# Patient Record
Sex: Female | Born: 1965 | Race: Black or African American | Hispanic: No | Marital: Married | Smoking: Former smoker
Health system: Southern US, Community
[De-identification: ages and names within clinical notes are randomized; demographics above are authoritative.]

## PROBLEM LIST (undated history)

## (undated) DIAGNOSIS — Z87440 Personal history of urinary (tract) infections: Secondary | ICD-10-CM

## (undated) DIAGNOSIS — Z90710 Acquired absence of both cervix and uterus: Secondary | ICD-10-CM

## (undated) DIAGNOSIS — C801 Malignant (primary) neoplasm, unspecified: Secondary | ICD-10-CM

## (undated) DIAGNOSIS — K219 Gastro-esophageal reflux disease without esophagitis: Secondary | ICD-10-CM

## (undated) DIAGNOSIS — R0602 Shortness of breath: Secondary | ICD-10-CM

## (undated) DIAGNOSIS — M79606 Pain in leg, unspecified: Secondary | ICD-10-CM

## (undated) DIAGNOSIS — B351 Tinea unguium: Secondary | ICD-10-CM

## (undated) HISTORY — DX: Tinea unguium: B35.1

## (undated) HISTORY — DX: Personal history of urinary (tract) infections: Z87.440

## (undated) HISTORY — PX: TUBAL LIGATION: SHX77

## (undated) HISTORY — DX: Pain in leg, unspecified: M79.606

## (undated) HISTORY — DX: Acquired absence of both cervix and uterus: Z90.710

## (undated) HISTORY — DX: Gastro-esophageal reflux disease without esophagitis: K21.9

## (undated) HISTORY — PX: OVARIAN CYST SURGERY: SHX726

---

## 2000-11-13 ENCOUNTER — Emergency Department (HOSPITAL_COMMUNITY): Admission: EM | Admit: 2000-11-13 | Discharge: 2000-11-13 | Payer: Self-pay | Admitting: Emergency Medicine

## 2000-11-13 ENCOUNTER — Encounter: Payer: Self-pay | Admitting: Emergency Medicine

## 2000-11-28 ENCOUNTER — Other Ambulatory Visit: Admission: RE | Admit: 2000-11-28 | Discharge: 2000-11-28 | Payer: Self-pay | Admitting: Internal Medicine

## 2000-12-12 ENCOUNTER — Encounter: Admission: RE | Admit: 2000-12-12 | Discharge: 2000-12-12 | Payer: Self-pay | Admitting: Internal Medicine

## 2000-12-12 ENCOUNTER — Encounter: Payer: Self-pay | Admitting: Internal Medicine

## 2002-07-16 ENCOUNTER — Other Ambulatory Visit: Admission: RE | Admit: 2002-07-16 | Discharge: 2002-07-16 | Payer: Self-pay | Admitting: Family Medicine

## 2003-02-12 ENCOUNTER — Emergency Department (HOSPITAL_COMMUNITY): Admission: EM | Admit: 2003-02-12 | Discharge: 2003-02-12 | Payer: Self-pay | Admitting: Emergency Medicine

## 2003-02-12 ENCOUNTER — Encounter: Payer: Self-pay | Admitting: Emergency Medicine

## 2005-03-14 ENCOUNTER — Ambulatory Visit: Payer: Self-pay | Admitting: Internal Medicine

## 2006-03-14 ENCOUNTER — Ambulatory Visit: Payer: Self-pay | Admitting: Internal Medicine

## 2006-03-26 ENCOUNTER — Ambulatory Visit: Payer: Self-pay | Admitting: Internal Medicine

## 2006-03-26 ENCOUNTER — Other Ambulatory Visit: Admission: RE | Admit: 2006-03-26 | Discharge: 2006-03-26 | Payer: Self-pay | Admitting: Internal Medicine

## 2006-03-26 ENCOUNTER — Encounter (INDEPENDENT_AMBULATORY_CARE_PROVIDER_SITE_OTHER): Payer: Self-pay | Admitting: *Deleted

## 2006-08-14 ENCOUNTER — Ambulatory Visit: Payer: Self-pay | Admitting: Internal Medicine

## 2006-09-04 ENCOUNTER — Encounter: Admission: RE | Admit: 2006-09-04 | Discharge: 2006-09-04 | Payer: Self-pay | Admitting: Internal Medicine

## 2006-09-22 ENCOUNTER — Ambulatory Visit: Payer: Self-pay | Admitting: Internal Medicine

## 2007-09-10 HISTORY — PX: ABDOMINAL HYSTERECTOMY: SHX81

## 2007-09-18 ENCOUNTER — Ambulatory Visit: Payer: Self-pay | Admitting: Internal Medicine

## 2007-09-18 DIAGNOSIS — N390 Urinary tract infection, site not specified: Secondary | ICD-10-CM

## 2007-09-18 LAB — CONVERTED CEMR LAB
ALT: 12 units/L (ref 0–35)
Alkaline Phosphatase: 69 units/L (ref 39–117)
BUN: 7 mg/dL (ref 6–23)
Basophils Absolute: 0 10*3/uL (ref 0.0–0.1)
Bilirubin Urine: NEGATIVE
Bilirubin, Direct: 0.1 mg/dL (ref 0.0–0.3)
CO2: 25 meq/L (ref 19–32)
Chloride: 106 meq/L (ref 96–112)
Cholesterol: 141 mg/dL (ref 0–200)
Creatinine, Ser: 0.7 mg/dL (ref 0.4–1.2)
Eosinophils Relative: 3.5 % (ref 0.0–5.0)
Glucose, Bld: 87 mg/dL (ref 70–99)
HDL: 66 mg/dL (ref >39.0)
Hemoglobin: 10 g/dL — ABNORMAL LOW (ref 12.0–15.0)
Ketones, urine, test strip: NEGATIVE
LDL Cholesterol: 68 mg/dL (ref 0–99)
MCHC: 33.2 g/dL (ref 30.0–36.0)
Monocytes Absolute: 0.6 10*3/uL (ref 0.2–0.7)
Neutro Abs: 4.4 10*3/uL (ref 1.4–7.7)
Neutrophils Relative %: 59.9 % (ref 43.0–77.0)
Protein, U semiquant: NEGATIVE
RBC: 3.98 M/uL (ref 3.87–5.11)
Triglycerides: 37 mg/dL (ref 0–149)
VLDL: 7 mg/dL (ref 0–40)
pH: 5.5

## 2007-10-16 ENCOUNTER — Other Ambulatory Visit: Admission: RE | Admit: 2007-10-16 | Discharge: 2007-10-16 | Payer: Self-pay | Admitting: Internal Medicine

## 2007-10-16 ENCOUNTER — Encounter: Payer: Self-pay | Admitting: Internal Medicine

## 2007-10-16 ENCOUNTER — Ambulatory Visit: Payer: Self-pay | Admitting: Internal Medicine

## 2007-10-16 DIAGNOSIS — K5900 Constipation, unspecified: Secondary | ICD-10-CM | POA: Insufficient documentation

## 2007-10-16 DIAGNOSIS — K219 Gastro-esophageal reflux disease without esophagitis: Secondary | ICD-10-CM

## 2007-10-16 DIAGNOSIS — N920 Excessive and frequent menstruation with regular cycle: Secondary | ICD-10-CM

## 2007-10-16 DIAGNOSIS — R5381 Other malaise: Secondary | ICD-10-CM | POA: Insufficient documentation

## 2007-10-16 DIAGNOSIS — D509 Iron deficiency anemia, unspecified: Secondary | ICD-10-CM

## 2007-10-16 DIAGNOSIS — R5383 Other fatigue: Secondary | ICD-10-CM

## 2007-10-16 LAB — CONVERTED CEMR LAB
Bilirubin Urine: NEGATIVE
Glucose, Urine, Semiquant: NEGATIVE
Ketones, urine, test strip: NEGATIVE
Nitrite: POSITIVE
Urobilinogen, UA: 1
WBC Urine, dipstick: NEGATIVE

## 2007-10-17 ENCOUNTER — Encounter: Payer: Self-pay | Admitting: Internal Medicine

## 2007-10-23 ENCOUNTER — Encounter: Admission: RE | Admit: 2007-10-23 | Discharge: 2007-10-23 | Payer: Self-pay | Admitting: Internal Medicine

## 2007-12-17 ENCOUNTER — Ambulatory Visit (HOSPITAL_COMMUNITY): Admission: RE | Admit: 2007-12-17 | Discharge: 2007-12-18 | Payer: Self-pay | Admitting: Obstetrics and Gynecology

## 2007-12-17 ENCOUNTER — Encounter (INDEPENDENT_AMBULATORY_CARE_PROVIDER_SITE_OTHER): Payer: Self-pay | Admitting: Obstetrics and Gynecology

## 2008-11-10 ENCOUNTER — Ambulatory Visit: Payer: Self-pay | Admitting: Internal Medicine

## 2008-11-10 LAB — CONVERTED CEMR LAB
AST: 21 units/L (ref 0–37)
Albumin: 3.3 g/dL — ABNORMAL LOW (ref 3.5–5.2)
Alkaline Phosphatase: 86 units/L (ref 39–117)
BUN: 8 mg/dL (ref 6–23)
Basophils Absolute: 0 10*3/uL (ref 0.0–0.1)
Bilirubin Urine: NEGATIVE
Bilirubin, Direct: 0.1 mg/dL (ref 0.0–0.3)
Blood in Urine, dipstick: NEGATIVE
Cholesterol: 147 mg/dL (ref 0–200)
GFR calc non Af Amer: 98 mL/min
HCT: 39 % (ref 36.0–46.0)
HDL: 56.4 mg/dL (ref >39.0)
Hemoglobin: 13.3 g/dL (ref 12.0–15.0)
Lymphocytes Relative: 28.9 % (ref 12.0–46.0)
MCHC: 34 g/dL (ref 30.0–36.0)
MCV: 93.8 fL (ref 78.0–100.0)
Monocytes Absolute: 0.4 10*3/uL (ref 0.1–1.0)
Neutrophils Relative %: 59.4 % (ref 43.0–77.0)
Potassium: 3.6 meq/L (ref 3.5–5.1)
Protein, U semiquant: NEGATIVE
RBC: 4.16 M/uL (ref 3.87–5.11)
Sodium: 142 meq/L (ref 135–145)
Triglycerides: 54 mg/dL (ref 0–149)
VLDL: 11 mg/dL (ref 0–40)
pH: 6.5

## 2008-12-27 ENCOUNTER — Telehealth: Payer: Self-pay | Admitting: Internal Medicine

## 2009-01-05 ENCOUNTER — Ambulatory Visit: Payer: Self-pay | Admitting: Internal Medicine

## 2009-01-05 DIAGNOSIS — G5600 Carpal tunnel syndrome, unspecified upper limb: Secondary | ICD-10-CM | POA: Insufficient documentation

## 2009-01-05 DIAGNOSIS — B351 Tinea unguium: Secondary | ICD-10-CM

## 2009-04-18 ENCOUNTER — Ambulatory Visit: Payer: Self-pay | Admitting: Internal Medicine

## 2009-04-18 DIAGNOSIS — R030 Elevated blood-pressure reading, without diagnosis of hypertension: Secondary | ICD-10-CM | POA: Insufficient documentation

## 2009-04-18 DIAGNOSIS — M79609 Pain in unspecified limb: Secondary | ICD-10-CM | POA: Insufficient documentation

## 2009-04-18 LAB — CONVERTED CEMR LAB
Bilirubin Urine: NEGATIVE
Blood in Urine, dipstick: NEGATIVE
Ketones, urine, test strip: NEGATIVE
Nitrite: POSITIVE
Specific Gravity, Urine: 1.025
WBC Urine, dipstick: NEGATIVE
pH: 5

## 2009-05-22 ENCOUNTER — Ambulatory Visit: Payer: Self-pay | Admitting: Internal Medicine

## 2009-05-22 DIAGNOSIS — B373 Candidiasis of vulva and vagina: Secondary | ICD-10-CM

## 2009-05-22 LAB — CONVERTED CEMR LAB
Bilirubin Urine: NEGATIVE
Blood in Urine, dipstick: NEGATIVE
Ketones, urine, test strip: NEGATIVE
Protein, U semiquant: NEGATIVE
Specific Gravity, Urine: 1.01
Urobilinogen, UA: 0.2

## 2009-08-18 ENCOUNTER — Encounter: Admission: RE | Admit: 2009-08-18 | Discharge: 2009-08-18 | Payer: Self-pay | Admitting: Internal Medicine

## 2009-08-25 ENCOUNTER — Encounter (INDEPENDENT_AMBULATORY_CARE_PROVIDER_SITE_OTHER): Payer: Self-pay | Admitting: *Deleted

## 2009-08-25 ENCOUNTER — Encounter: Admission: RE | Admit: 2009-08-25 | Discharge: 2009-08-25 | Payer: Self-pay | Admitting: Internal Medicine

## 2010-02-23 ENCOUNTER — Ambulatory Visit: Payer: Self-pay | Admitting: Internal Medicine

## 2010-02-23 LAB — CONVERTED CEMR LAB
ALT: 18 units/L (ref 0–35)
AST: 19 units/L (ref 0–37)
Alkaline Phosphatase: 93 units/L (ref 39–117)
Basophils Relative: 0.5 % (ref 0.0–3.0)
Bilirubin, Direct: 0.1 mg/dL (ref 0.0–0.3)
CO2: 26 meq/L (ref 19–32)
Chloride: 108 meq/L (ref 96–112)
Eosinophils Relative: 2.9 % (ref 0.0–5.0)
Glucose, Bld: 85 mg/dL (ref 70–99)
HDL: 68.1 mg/dL (ref >39.00)
MCHC: 34.6 g/dL (ref 30.0–36.0)
MCV: 96.8 fL (ref 78.0–100.0)
Platelets: 237 10*3/uL (ref 150.0–400.0)
Potassium: 4.3 meq/L (ref 3.5–5.1)
RDW: 13.7 % (ref 11.5–14.6)
Sodium: 140 meq/L (ref 135–145)
Total CHOL/HDL Ratio: 2
Total Protein: 7.2 g/dL (ref 6.0–8.3)
Triglycerides: 119 mg/dL (ref 0.0–149.0)
VLDL: 23.8 mg/dL (ref 0.0–40.0)

## 2010-03-02 ENCOUNTER — Encounter: Admission: RE | Admit: 2010-03-02 | Discharge: 2010-03-02 | Payer: Self-pay | Admitting: Internal Medicine

## 2010-04-06 ENCOUNTER — Telehealth: Payer: Self-pay | Admitting: Internal Medicine

## 2010-06-20 ENCOUNTER — Ambulatory Visit: Payer: Self-pay | Admitting: Family Medicine

## 2010-06-20 ENCOUNTER — Encounter: Payer: Self-pay | Admitting: Internal Medicine

## 2010-06-20 ENCOUNTER — Telehealth: Payer: Self-pay | Admitting: Internal Medicine

## 2010-06-20 DIAGNOSIS — E663 Overweight: Secondary | ICD-10-CM | POA: Insufficient documentation

## 2010-06-20 DIAGNOSIS — IMO0002 Reserved for concepts with insufficient information to code with codable children: Secondary | ICD-10-CM

## 2010-08-17 ENCOUNTER — Encounter: Payer: Self-pay | Admitting: Internal Medicine

## 2010-08-17 ENCOUNTER — Ambulatory Visit: Payer: Self-pay | Admitting: Internal Medicine

## 2010-08-17 DIAGNOSIS — M545 Low back pain, unspecified: Secondary | ICD-10-CM | POA: Insufficient documentation

## 2010-08-17 DIAGNOSIS — E669 Obesity, unspecified: Secondary | ICD-10-CM

## 2010-08-17 DIAGNOSIS — M549 Dorsalgia, unspecified: Secondary | ICD-10-CM | POA: Insufficient documentation

## 2010-09-04 ENCOUNTER — Encounter
Admission: RE | Admit: 2010-09-04 | Discharge: 2010-09-04 | Payer: Self-pay | Source: Home / Self Care | Attending: Internal Medicine | Admitting: Internal Medicine

## 2010-09-04 LAB — HM MAMMOGRAPHY

## 2010-10-09 NOTE — Progress Notes (Signed)
Summary: Request to Switch PCP  Phone Note Call from Patient Call back at Home Phone [phone]   Caller: Patient Summary of Call: Pt requests to switch pcp from [facility] to Aashish Hamm please advise if of to transfer. Initial call taken by: Trixie Dredge,  June 20, 2010 4:02 PM  Follow-up for Phone Call        ok if ok with Dr Kirtland Bouchard Follow-up by: Madelin Headings MD,  June 20, 2010 5:49 PM  Additional Follow-up for Phone Call Additional follow up Details #1::        ok Additional Follow-up by: Gordy Savers  MD,  June 21, 2010 8:40 AM    Additional Follow-up for Phone Call Additional follow up Details #2::    LMOM to return call. Follow-up by: Trixie Dredge,  June 21, 2010 9:16 AM

## 2010-10-09 NOTE — Progress Notes (Signed)
Summary: NO CALL NO SHOW cpx  Phone Note Outgoing Call   Call placed by: Duard Brady LPN,  April 06, 2010 1:46 PM Call placed to: Patient Action Taken: Phone Call Completed Summary of Call: NO CALL NO SHOW - cpx - attempt to call at hm# - ans mach - LMTCB to r/s. KIK Initial call taken by: Duard Brady LPN,  April 06, 2010 1:46 PM

## 2010-10-09 NOTE — Assessment & Plan Note (Signed)
Summary: TENDONITIS FLARE UP / PT TO LAB AFTER APPT (CPX LABS V70.0 //...   Vital Signs:  Patient profile:   45 year old female Weight:      204 pounds Temp:     98.7 degrees F oral BP sitting:   120 / 80  (right arm) Cuff size:   regular  Vitals Entered By: Duard Brady LPN (February 23, 2010 8:30 AM) CC: Tendontiits both arms Is Patient Diabetic? No   CC:  Tendontiits both arms.  History of Present Illness: 45 year old patient who presents with a two-day history of some shoulder.  The discomfort and tightness also describes some paresthesias down the left arm and also the right arm across to the right anterior chest area.  She states that 6 years ago.  She was treated for tendinitis involving the left elbow.  She does have a history of carpal tunnel syndrome and does use a wrist braces.  Sterile fashion using a brace, involving the left elbow region.  She denies any significant neck pain or any loss in strength.  Allergies (verified): No Known Drug Allergies  Past History:  Past Medical History: Reviewed history from 04/18/2009 and no changes required. GERD toenail onychomycosis leg pain  Review of Systems  The patient denies anorexia, fever, weight loss, weight gain, vision loss, decreased hearing, hoarseness, chest pain, syncope, dyspnea on exertion, peripheral edema, prolonged cough, headaches, hemoptysis, abdominal pain, melena, hematochezia, severe indigestion/heartburn, hematuria, incontinence, genital sores, muscle weakness, suspicious skin lesions, transient blindness, difficulty walking, depression, unusual weight change, abnormal bleeding, enlarged lymph nodes, angioedema, and breast masses.    Physical Exam  General:  overweight-appearing.  normal blood pressure, no distressoverweight-appearing.   Msk:  No deformity or scoliosis noted of thoracic or lumbar spine.  no joint tenderness, no joint swelling, no redness over joints, and no joint instability.  no joint  tenderness, no joint swelling, no redness over joints, and no joint instability.   Neurologic:  alert & oriented X3, cranial nerves II-XII intact, strength normal in all extremities, and DTRs symmetrical and normal.  alert & oriented X3, cranial nerves II-XII intact, strength normal in all extremities, and DTRs symmetrical and normal.     Impression & Recommendations:  Problem # 1:  PAIN IN SOFT TISSUES OF LIMB (ICD-729.5)  Complete Medication List: 1)  Diclofenac Sodium 75 Mg Tbec (Diclofenac sodium) .... One twice daily  Patient Instructions: 1)  You may move around but avoid painful motions. Apply ice to sore area for 20 minutes 3-4 times a day for 2-3 days. Prescriptions: DICLOFENAC SODIUM 75 MG TBEC (DICLOFENAC SODIUM) one twice daily  #20 x 4   Entered and Authorized by:   Gordy Savers  MD   Signed by:   Gordy Savers  MD on 02/23/2010   Method used:   [facility]  [address] [phone]* (retail)       [address]       [place], [place]  [id number]       Ph: [phone] or [phone]       Fax: [phone]   RxID:   [id number]   Appended Document: TENDONITIS FLARE UP / PT TO LAB AFTER APPT (CPX LABS V70.0 //...   Appended Document: TENDONITIS FLARE UP / PT TO LAB AFTER APPT (CPX LABS V70.0 //...  Laboratory Results   Urine Tests    Routine Urinalysis  Color: yellow Appearance: Clear Glucose: negative   (Normal Range: Negative) Bilirubin: negative   (Normal Range: Negative) Ketone: negative   (Normal Range: Negative) Spec. Gravity: 1.020   (Normal Range: 1.003-1.035) Blood: negative   (Normal Range: Negative) pH: 7.0   (Normal Range: 5.0-8.0) Protein: negative   (Normal Range: Negative) Urobilinogen: 1.0   (Normal Range: 0-1) Nitrite: negative   (Normal Range: Negative) Leukocyte Esterace: negative   (Normal Range: Negative)    Comments: Rita Ohara  February 23, 2010 10:16 AM

## 2010-10-09 NOTE — Assessment & Plan Note (Signed)
Summary: rash on arm/njr   Vital Signs:  Patient profile:   45 year old female Height:      66.5 inches (168.91 cm) Weight:      206 pounds (93.64 kg) BMI:     32.87 O2 Sat:      98 % on Room air Temp:     98.4 degrees F (36.89 degrees C) oral Pulse rate:   84 / minute BP sitting:   140 / 82  (left arm) Cuff size:   regular  Vitals Entered By: Josph Macho RMA (June 20, 2010 3:07 PM)  O2 Flow:  Room air CC: Rash (blister) on left arm X2 days- itches, red, warm to touch/ Rash was on right arm a couple of months ago/ CF Is Patient Diabetic? No   History of Present Illness: patient is a 45 year old African American nonsmoking female. She is in today with complaints of a blister pain swelling and redness over her distal left arm. Over the last couple of month she's had a very similar lesion recur 3 different times on her right arm he was told at one point for MRSA and was found to be negative. She was treated with antibiotics and it resolved each time this new lesion was 2 days ago and she denies any obvious bug bites. It started as a small blister very mildly paretic and has since become warm and raised and red around it. She is having some mild myalgias notably some left hip and left neck pain but denies fevers, chills, chest pain, palpitations, shortness of breath, GI or GU complaints  Current Medications (verified): 1)  None  Allergies (verified): No Known Drug Allergies  Past History:  Past medical history reviewed for relevance to current acute and chronic problems. Social history (including risk factors) reviewed for relevance to current acute and chronic problems.  Past Medical History: Reviewed history from 04/18/2009 and no changes required. GERD toenail onychomycosis leg pain  Social History: Reviewed history from 10/16/2007 and no changes required. 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 No apparent alopecia or balding. Mouth:  Oral mucosa and oropharynx without lesions or exudates.  Teeth in good repair. Neck:  No deformities, masses, or tenderness noted. Lungs:  Normal respiratory effort, chest expands symmetrically. Lungs are clear to auscultation, no crackles or wheezes. Heart:  Normal rate and regular rhythm. S1 and S2 normal without gallop, murmur, click, rub or other extra sounds. Abdomen:  Bowel sounds positive,abdomen soft and non-tender without masses, organomegaly or hernias noted. Extremities:  No clubbing, cyanosis, edema, or deformity noted with normal full range of motion of all joints.   Skin:  4mm vesicular lesion caudal to left elbow, surrounding area is fluctuant and erythematous, raised at roughly 4 cm in diameter Psych:  Cognition and judgment appear intact. Alert and cooperative with normal attention span and concentration. No apparent delusions, illusions, hallucinations   Impression & Recommendations:  Problem # 1:  CELLULITIS AND ABSCESS OF UPPER ARM AND FOREARM (ICD-682.3)  Her updated medication list for this problem includes:    Doxycycline Hyclate 100 Mg Solr (Doxycycline hyclate) .Marland Kitchen... 1 tab by mouth two times a day x 10 days  Orders: T-Culture, Wound ([id number]) wash daily with Cetaphil soap and cleanse any abrasions or bug bites with Weyman Croon  Hazel Astringent in the future.  Problem # 2:  ELEVATED BP READING WITHOUT DX HYPERTENSION (ICD-796.2) Mild elevation today, avoid sodium attempt weight loss and increase exercise. Need 7-8 hours of sleep  Problem # 3:  OVERWEIGHT (ICD-278.02) Likely contributing to increased joint pains, encouraged increase exercise, decrease by mouth intake, use lean proteins, complex carbs, small  frequent meals and 8 hours of sleep   Complete Medication List: 1)  Naproxen 500 Mg Tabs (Naproxen) .Marland Kitchen.. 1 tab by mouth two times a day as needed pain with food 2)  Cyclobenzaprine Hcl 10 Mg Tabs (Cyclobenzaprine hcl) .Marland Kitchen.. 1 tab by mouth at bedtime as needed pain 3)  Fish Oil 1000 Mg Caps (Omega-3 fatty acids) .Marland Kitchen.. 1-2 caps by mouth daily 4)  Bacitracin 500 Unit/gm Oint (Bacitracin) .... Apply small amount to b/l nostrils at bedtime x 7days 5)  Cetaphil Antibacterial 0.3 % Bar (Triclosan) .... Wash daily 6)  Doxycycline Hyclate 100 Mg Solr (Doxycycline hyclate) .Marland Kitchen.. 1 tab by mouth two times a day x 10 days  Patient Instructions: 1)  Please schedule a follow-up appointment as needed if lesions worsens or does not improve.  2)  Take a bath weekly with a Tbls by mouth bleach in water Prescriptions: DOXYCYCLINE HYCLATE 100 MG SOLR (DOXYCYCLINE HYCLATE) 1 tab by mouth two times a day x 10 days  #20 x 0   Entered and Authorized by:   Danise Edge MD   Signed by:   Danise Edge MD on 06/20/2010   Method used:   [facility] [address] [phone]* (retail)       [address]       [place], [place]  [address]       Ph: [phone] or [phone]       Fax: [phone]   RxID:   [id number] CYCLOBENZAPRINE HCL 10 MG TABS (CYCLOBENZAPRINE HCL) 1 tab by mouth at bedtime as needed pain  #30 x 2   Entered and Authorized by:   Danise Edge MD   Signed by:   Danise Edge MD on 06/20/2010   Method used:   [facility]        [facility] [address] [phone]* (retail)       [address]       [place], [place]  [address]       Ph: [phone] or [phone]       Fax: [phone]   RxID:   [id number] NAPROXEN 500 MG TABS (NAPROXEN) 1 tab by mouth two times a day as needed pain with food  #60 x 2   Entered and Authorized by:   Danise Edge MD   Signed by:   Danise Edge MD on 06/20/2010   Method used:    [facility]        [facility]  [address] [phone]* (retail)       [address]       [place], [place]  [address]       Ph: [phone] or [phone]       Fax: [phone]   RxID:   [id number]

## 2010-10-11 NOTE — Letter (Signed)
Summary: Patient Information form  Patient Information form   Imported By: Maryln Gottron 09/14/2010 09:21:46  _____________________________________________________________________  External Attachment:    Type:   Image     Comment:   External Document

## 2010-10-11 NOTE — Assessment & Plan Note (Signed)
Summary: NEW CPX--OK PER DR//CCM   Vital Signs:  Patient profile:   45 year old female Menstrual status:  hysterectomy Height:      66.25 inches Weight:      208 pounds Pulse rate:   66 / minute BP sitting:   120 / 80  (left arm) Cuff size:   regular  Vitals Entered By: Romualdo Bolk, CMA (AAMA) (August 17, 2010 3:07 PM) CC: Pt is here for a CPX- Pt is changing md's to Dr. Fabian Sharp.     Menstrual Status hysterectomy   History of Present Illness: Kelly Baldwin comes in today   for np preventive visit .   She is generally well  has hx of utis  had hysterectomy for non cancer reasons. No  current specialty needs . She has an active job woth bending and liftling a lot.  some fatigue but no   progressive doe.  She has been having problems with her back and then feet pain  no weakness or numbness . she is active at work  .  no claudication .  She has been trying to lose wwight with weight watcher but recent;y  doesn not feel successful with this.    Preventive Care Screening  Prior Values:    Mammogram:  BI-RADS CATEGORY 3:  Probably benign finding(s) - short interval^MM DIGITAL DIAG LTD R (08/25/2009)    Last Tetanus Booster:  Tdap (10/16/2007)   Preventive Screening-Counseling & Management  Alcohol-Tobacco     Alcohol drinks/day: <1     Smoking Status: quit     Year Quit: 2007     Pack years: 10  Caffeine-Diet-Exercise     Caffeine use/day: 1 cup a day     Does Patient Exercise: no  Hep-HIV-STD-Contraception     Dental Visit-last 6 months no     Sun Exposure-Excessive: no  Safety-Violence-Falls     Seat Belt Use: yes     Firearms in the Home: no firearms in the home     Smoke Detectors: yes     Violence in the Home: no risk noted     Fall Risk: no   Current Medications (verified): 1)  Naproxen 500 Mg Tabs (Naproxen) .Marland Kitchen.. 1 Tab By Mouth Two Times A Day As Needed Pain With Food 2)  Cyclobenzaprine Hcl 10 Mg Tabs (Cyclobenzaprine Hcl) .Marland Kitchen.. 1 Tab By Mouth At  Bedtime As Needed Pain  Allergies (verified): No Known Drug Allergies  Past History:  Past Medical History: GERD toenail onychomycosis leg pain G2 P2  UTIS  Past Surgical History: Tubal ligation status post hysterectomy 2009 bleeding   non cancer   partial   Past History:  Care Management: None Current  Family History: Family History Diabetes 1st degree relative-father Family History High cholesterol-father Family History Hypertension-father Family History of CAD Female 1st degree relative---father  mother--dm, htn father age 82.  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  2 kids healthy Former Smoker Alcohol use-yes Regular exercise-no Sleep 8 hours   Helps 36 year old grandson Caffeine use/day:  1 cup a day Seat Belt Use:  yes Dental Care w/in 6 mos.:  no Fall Risk:  no  Sun Exposure-Excessive:  no  Review of Systems  The patient denies anorexia, fever, weight loss, decreased hearing, prolonged cough, melena, hematochezia, severe indigestion/heartburn, transient blindness, abnormal bleeding, enlarged lymph nodes, and angioedema.         12 system review  neg for sig cv pulm  gu isseues  she has some constiptation  gerd  and difficulty losing weight and fatigue  No osa signs   Physical Exam  General:  Well-developed,well-nourished,in no acute distress; alert,appropriate and cooperative throughout examination Head:  Normocephalic and atraumatic without obvious abnormalities. No apparent alopecia or balding. Eyes:  PERRL, EOMs full, conjunctiva clear  Ears:  R ear normal, L ear normal, and no external deformities.   Nose:  no external deformity, no  external erythema, and no nasal discharge.   Mouth:  pharynx pink and moist.   Neck:  No deformities, masses, or tenderness noted. Breasts:  No mass, nodules, thickening, tenderness, bulging, retraction, inflamation, nipple discharge or skin changes noted.   Lungs:  Normal respiratory effort, chest expands symmetrically. Lungs are clear to auscultation, no crackles or wheezes. Heart:  Normal rate and regular rhythm. S1 and S2 normal without gallop, murmur, click, rub or other extra sounds. Abdomen:  Bowel sounds positive,abdomen soft and non-tender without masses, organomegaly or hernias noted. Msk:  no joint swelling, no joint warmth, and no redness over joints.   Pulses:  pulses intact without delay   Extremities:  no clubbing cyanosis or edema  Neurologic:  alert & oriented X3, strength normal in all extremities, and gait normal.  grossly non focal  Pt is A&Ox3,affect,speech,memory,attention,&motor skills appear intact.  Skin:  turgor normal, color normal, no ecchymoses, and no petechiae.   Cervical Nodes:  No lymphadenopathy noted Axillary Nodes:  No palpable lymphadenopathy Psych:  Normal eye contact, appropriate affect. Cognition appears normal.   EKG nsr no acute changes  labs done in June of this year reviewed and normal  Impression & Recommendations:  Problem # 1:  Preventive Health Care (ICD-V70.0)  Discussed nutrition,exercise,diet,healthy weight, vitamin D and calcium.     ekg nsr   Orders: EKG w/ Interpretation (93000)  Problem # 2:  BACK PAIN (ICD-724.5)  Her updated medication list for this problem includes:    Naproxen 500 Mg Tabs (Naproxen) .Marland Kitchen... 1 tab by mouth two times a day as needed pain with food    Cyclobenzaprine Hcl 10 Mg Tabs (Cyclobenzaprine hcl) .Marland Kitchen... 1 tab by mouth at bedtime as needed pain  Orders: Orthopedic Referral (Ortho)  Problem # 3:  FOOT PAIN (ICD-729.5)  prob mechanical  ? radiating  vs local   Orders: Orthopedic Referral  (Ortho)  Problem # 4:  OBESITY (ICD-278.00) weight loss may help her pain issues  Ht: 66.25 (08/17/2010)   Wt: 208 (08/17/2010)   BMI: 32.87 (06/20/2010)  Complete Medication List: 1)  Naproxen 500 Mg Tabs (Naproxen) .Marland Kitchen.. 1 tab by mouth two times a day as needed pain with food 2)  Cyclobenzaprine Hcl 10 Mg Tabs (Cyclobenzaprine hcl) .Marland Kitchen.. 1 tab by mouth at bedtime as needed pain  Patient Instructions: 1)  weight loss will helps the joints and fatigue 2)  No eating out  for a  months record   intake and activity for 2 weeks.  3)  Consider weight watchers .   4)  have protein with every meal snack. 5)  Avoid sweet tea.  6)  Increase   fruits and veges    and miralax  1 capful   per day.   7)  fas needed for constipation.  8)  someone will call you about  referral for your back and feet pain.   Contraindications/Deferment of Procedures/Staging:    Test/Procedure: FLU VAX    Reason for deferment: patient declined    Orders Added: 1)  Orthopedic Referral [Ortho] 2)  New Patient 40-64 years [99386] 3)  EKG w/ Interpretation [93000]

## 2011-01-18 ENCOUNTER — Other Ambulatory Visit (INDEPENDENT_AMBULATORY_CARE_PROVIDER_SITE_OTHER): Payer: BC Managed Care – PPO | Admitting: Internal Medicine

## 2011-01-18 DIAGNOSIS — Z0000 Encounter for general adult medical examination without abnormal findings: Secondary | ICD-10-CM

## 2011-01-18 LAB — POCT URINALYSIS DIPSTICK
Blood, UA: NEGATIVE
Leukocytes, UA: NEGATIVE
Nitrite, UA: NEGATIVE

## 2011-01-18 LAB — HEPATIC FUNCTION PANEL
AST: 16 U/L (ref 0–37)
Albumin: 3.4 g/dL — ABNORMAL LOW (ref 3.5–5.2)
Alkaline Phosphatase: 80 U/L (ref 39–117)
Bilirubin, Direct: 0.1 mg/dL (ref 0.0–0.3)
Total Protein: 6.3 g/dL (ref 6.0–8.3)

## 2011-01-18 LAB — BASIC METABOLIC PANEL
CO2: 28 mEq/L (ref 19–32)
Calcium: 8.8 mg/dL (ref 8.4–10.5)
Chloride: 104 mEq/L (ref 96–112)
Creatinine, Ser: 0.7 mg/dL (ref 0.4–1.2)
GFR: 110.93 mL/min (ref >60.00)
Glucose, Bld: 79 mg/dL (ref 70–99)
Potassium: 3.8 mEq/L (ref 3.5–5.1)

## 2011-01-18 LAB — LIPID PANEL
Cholesterol: 154 mg/dL (ref 0–200)
HDL: 61.9 mg/dL (ref >39.00)
LDL Cholesterol: 82 mg/dL (ref 0–99)
Triglycerides: 50 mg/dL (ref 0.0–149.0)

## 2011-01-18 LAB — CBC WITH DIFFERENTIAL/PLATELET
Eosinophils Relative: 3.4 % (ref 0.0–5.0)
HCT: 38.8 % (ref 36.0–46.0)
Hemoglobin: 13.4 g/dL (ref 12.0–15.0)
Lymphs Abs: 1.7 10*3/uL (ref 0.7–4.0)
Monocytes Absolute: 0.4 10*3/uL (ref 0.1–1.0)
Monocytes Relative: 6.4 % (ref 3.0–12.0)
Neutro Abs: 4.7 10*3/uL (ref 1.4–7.7)
WBC: 7.1 10*3/uL (ref 4.5–10.5)

## 2011-01-18 LAB — TSH: TSH: 1.25 u[IU]/mL (ref 0.35–5.50)

## 2011-01-22 NOTE — H&P (Signed)
NAME:  Kelly Baldwin, Kelly Baldwin                ACCOUNT NO.:  [id number]   MEDICAL RECORD NO.:  [id number]          PATIENT TYPE:  AMB   LOCATION:  [facility]                           FACILITY:  [facility]   PHYSICIAN:  Zenaida Niece, M.D.DATE OF BIRTH:  [birth year]   DATE OF ADMISSION:  DATE OF DISCHARGE:                              HISTORY & PHYSICAL   CHIEF COMPLAINT:  Symptomatic leiomyomatous uterus.   HISTORY AND PHYSICAL:  This is a 45 year old female para 2-0-0-2 who was  referred by Dr. Cato Mulligan in February of this year.  She had seen him for  irregular periods.  He did an ultrasound which revealed several large  fibroids, the largest measuring 7 x 6 x 7 cm.  Again, with this she is  having irregular periods which are heavier with some pelvic pain and  pressure.  This is despite being on birth control pills. Exam reveals an  irregular uterus 12 to 14 weeks size with a large fibroid at the left  uterine fundus.  All medical and surgical options were discussed with  the patient and she wishes to proceed with definitive surgical therapy.  She is being admitted for hysterectomy at this time.   PAST OB HISTORY:  Significant for 2 vaginal deliveries at term without  complications.   PAST MEDICAL HISTORY:  Negative.   PAST SURGICAL HISTORY:  Tubal ligation in 1997.   ALLERGIES:  None known.   CURRENT MEDICATIONS:  Desogen birth control pills and iron sulfate.   SOCIAL HISTORY:  She is married and denies alcohol, tobacco or drug use.   FAMILY HISTORY:  No GYN or colon cancer.   GYN HISTORY:  No history of abnormal Pap smears or other problems other  than the above-mentioned problems with her fibroids.   REVIEW OF SYSTEMS:  She has normal bowel and bladder function.   PHYSICAL EXAM:  Generally this is a well-developed female in no acute  distress.  Weight is 198 pounds, blood pressure 130/80.  Neck is supple without lymphadenopathy or thyromegaly.  Lungs are clear to auscultation.  Heart is  regular rate and rhythm without murmur.  Abdomen is soft, nontender, nondistended without palpable masses.  The  uterine fundus is palpable just above the pubic symphysis.  On pelvic exam, external genitalia has no lesions.  On speculum exam the  cervix is normal.  On bimanual exam she has an irregular uterus that is  12-14 weeks in size with a large fibroid at the left fundus.  Extremities have no edema and are nontender.   ASSESSMENT:  She has a symptomatic leiomyomatous uterus.  All  nonsurgical and surgical options have been discussed with the patient  and she wishes to proceed with definitive surgical therapy.  All routes  of surgery and risks of surgery have been discussed.   PLAN:  To admit the patient on the day of surgery to attempt a  laparoscopic-assisted vaginal hysterectomy.  The patient understands we  may need to proceed with abdominal hysterectomy.  She has been treated  with Septra for a preoperative UTI.  Zenaida Niece, M.D.  Electronically Signed     TDM/MEDQ  D:  12/16/2007  T:  12/16/2007  Job:  [id number]

## 2011-01-22 NOTE — Op Note (Signed)
Kelly Baldwin, Kelly Baldwin                ACCOUNT NO.:  [id number]   MEDICAL RECORD NO.:  [id number]          PATIENT TYPE:  AMB   LOCATION:  SDC                           FACILITY:  [facility]   PHYSICIAN:  Zenaida Niece, M.D.DATE OF BIRTH:  [birth year]   DATE OF PROCEDURE:  12/17/2007  DATE OF DISCHARGE:                               OPERATIVE REPORT   PREOPERATIVE DIAGNOSIS:  Symptomatic leiomyomatous uterus.   POSTOPERATIVE DIAGNOSIS:  Symptomatic leiomyomatous uterus.   PROCEDURE:  Laparoscopic-assisted vaginal hysterectomy and cystoscopy.   SURGEON:  Zenaida Niece, M.D.   ASSISTANT:  Huel Cote, M.D.   ANESTHESIA:  General endotracheal tube.   FINDINGS:  She had a large irregular uterus and a large pedunculated  right fundal myoma.  She had a normal bladder and ureters via  cystoscopy.   SPECIMENS:  Uterus that weighed approximately 450 grams sent to  pathology.   ESTIMATED BLOOD LOSS:  300 mL.   COMPLICATIONS:  None.   PROCEDURE IN DETAIL:  The patient was taken to the operating room and  placed in the dorsal supine position.  General anesthesia was induced  and she was placed in mobile stirrups.  Abdomen, perineum and vagina  were then prepped and draped in the usual sterile fashion, bladder  drained with a latex free catheter and a Hulka tenaculum applied to the  cervix for uterine manipulation.  Infraumbilical skin was then  infiltrated with 0.25% Marcaine and a 0.75 cm vertical incision was  made.  The Veress needle was inserted into the peritoneal cavity and  placement confirmed by the water drop test and opening pressure of 6  mmHg.  CO2 gas was insufflated to a pressure of 12 mmHg and the Veress  needle was removed.  A 5 mm trocar was then introduced with direct  visualization with the laparoscope.  5 mm ports were then placed  bilaterally also under direct visualization.  Inspection revealed the  above-mentioned findings with a enlarged uterus and large  pedunculated  fibroid from the right fundus.  However, the uterus was mobile enough  that I felt we could do this laparoscopically assisted through the  vagina.  Using the Harmonic scalpel Ace the left round ligament, utero-  ovarian pedicle and broad ligaments were taken down with adequate  hemostasis.  The anterior peritoneum was incised across the anterior  portion of the uterus and the bladder pushed inferior.  The left uterine  artery was skeletonized, but not taken down with Harmonic scalpel.  A  similar procedure was then performed on the right side, pulling the  uterus to the left.  This achieved good visualization.  Bladder was  pushed inferior and all pedicles were felt to be hemostatic.   Attention was turned vaginally.  The legs were elevated in the stirrups  and a weighted speculum was placed in the vagina.  Christella Hartigan tenaculums  were placed on the cervix and the cervicovaginal mucosa was infiltrated  with a dilute solution of Pitressin and then incised circumferentially  with electrocautery.  The anterior vagina was dissected off of the  cervix and  the anterior peritoneum was identified and entered and a  Deaver retractor used to retract the bladder anterior.  Posterior cul-de-  sac was identified and entered sharply and a bonnano speculum placed  into the posterior cul-de-sac.  Uterosacral ligaments were then clamped,  transected and ligated with #1 chromic and tagged for later use.  Cardinal ligaments and uterine arteries were clamped, transected and  ligated with #1 chromic.  I was able to eventually get a bite on the  patient's right side that freed  that side of the uterus.  I could not  could palpate well enough on the patient's left side to get a bite to  free the uterus.  Using towel clips I was able to deliver the uterus and  large fibroid through the incision and the left side was found to be  free.  Bleeding from a couple small areas was controlled with   electrocautery.  The uterosacral ligaments were then plicated in the  midline with 2-0 silk.  Another suture of #1 chromic was used to plicate  the uterosacral ligaments and the previously tagged uterosacral pedicles  were also tied in the midline.  The vagina was then closed in a vertical  fashion with running locking 2-0 Vicryl with adequate closure and  adequate hemostasis.   Attention was turned to cystoscopy.  The patient was given indigo  carmine IV.  A 70 degrees cystoscope was inserted and sterile solution  instilled into the bladder.  The bladder appeared normal.  Both ureteral  orifices were identified and had good jets of blue urine.  The  cystoscope was removed and a Foley catheter was placed.   Dr. Senaida Ores, myself and the scrub techs changed gloves.  Legs were  lowered in the stirrups.  The abdomen was reinsufflated with CO2 gas and  inspected with the laparoscope.  There was good amount of clot in the  lower abdomen and up around the liver.  As much blood as possible was  removed with the Nezhat suction.  The pelvis was copiously irrigated and  a small amount of bleeding from the right utero-ovarian pedicle was made  hemostatic with the Harmonic scalpel.  The remainder of the vaginal cuff  and all other pedicles appeared hemostatic.  Again, as much blood and  fluid was removed as possible.  The lateral trocars were then removed  under direct visualization.  All gas was allowed to deflate from the  abdomen and the umbilical trocar was removed.  Both lateral incisions  continued to bleed slightly so they were closed with interrupted through-  and-through sutures of 4-0 Vicryl.  Umbilical incision was closed with a  subcuticular suture of 4-0 Vicryl followed by Dermabond.  The patient  tolerated the procedure well.  She was extubated in the operating room  and taken to the recovery room in stable condition.  Counts were correct  x2.  She was given Ancef 1 gram IV prior to  the procedure and had PAS  hose on throughout the procedure.      Zenaida Niece, M.D.  Electronically Signed     TDM/MEDQ  D:  12/17/2007  T:  12/17/2007  Job:  [id number]

## 2011-01-29 ENCOUNTER — Encounter: Payer: Self-pay | Admitting: Internal Medicine

## 2011-02-05 ENCOUNTER — Ambulatory Visit (INDEPENDENT_AMBULATORY_CARE_PROVIDER_SITE_OTHER): Payer: BC Managed Care – PPO | Admitting: Internal Medicine

## 2011-02-05 ENCOUNTER — Encounter: Payer: Self-pay | Admitting: Internal Medicine

## 2011-02-05 VITALS — BP 140/96 | HR 66 | Ht 66.75 in | Wt 204.0 lb

## 2011-02-05 DIAGNOSIS — R519 Headache, unspecified: Secondary | ICD-10-CM | POA: Insufficient documentation

## 2011-02-05 DIAGNOSIS — R51 Headache: Secondary | ICD-10-CM | POA: Insufficient documentation

## 2011-02-05 DIAGNOSIS — M79609 Pain in unspecified limb: Secondary | ICD-10-CM

## 2011-02-05 DIAGNOSIS — R030 Elevated blood-pressure reading, without diagnosis of hypertension: Secondary | ICD-10-CM

## 2011-02-05 DIAGNOSIS — Z0000 Encounter for general adult medical examination without abnormal findings: Secondary | ICD-10-CM

## 2011-02-05 DIAGNOSIS — J309 Allergic rhinitis, unspecified: Secondary | ICD-10-CM | POA: Insufficient documentation

## 2011-02-05 DIAGNOSIS — M79671 Pain in right foot: Secondary | ICD-10-CM | POA: Insufficient documentation

## 2011-02-05 MED ORDER — TRIAMTERENE-HCTZ 37.5-25 MG PO CAPS: 1.0000 | ORAL_CAPSULE | Freq: Every day | ORAL | Status: DC

## 2011-02-05 MED ORDER — FLUTICASONE PROPIONATE 50 MCG/ACT NA SUSP: 2.0000 | Freq: Every day | NASAL | Status: DC

## 2011-02-05 NOTE — Patient Instructions (Signed)
Obtain your own machine preferably a large cuff. Take a number of readings and if after 5-6 readings it is still 140/90 and begin taking the blood pressure medicine every day.  ROP and 1-2 months   bring the machine with you at that time. We will decide on further followup and labs. Begin Flonase every day to see if this helps her congestion and headaches. Get the x-ray of your right heel when it is convenient. Otherwise continue at healthy lifestyle intervention

## 2011-02-05 NOTE — Assessment & Plan Note (Signed)
Seems left sided with congestion poss eustachian tube dysfunction  . Begin on flonase and monitor  . Treat bp if needed

## 2011-02-05 NOTE — Progress Notes (Signed)
Subjective:    Patient ID: Kelly Baldwin, female    DOB: Jan 20, 1966, 45 y.o.   MRN: [id number]  HPI Patient comes in today for wellness visit and a couple of other issues. 1.? Sinuses  Headache recently no help with otc meds sudafed. Has some ongoing congestion history of some sneezing that chest pain or shortness of breath. 2: problem with blood pressure tends to run high in the 140 145 range and has done this in the past. Is a bit worried about it although she has no symptoms of such there is a family history. No history of sleep apnea. 3: problems with some swelling and a little bit of pain behind her right heel. She has no injuries but she noted that it was swollen no pain on standing mostly to touch. Otherwise no major changes in health since her last visit.  Review of Systems Right medial heel swelling    More at end of day  . No injury stand on feet a lot.  Rest of ROS negative for unusual bleeding cardiovascular pulmonary vision hearing changes. Past Medical History  Diagnosis Date  . GERD (gastroesophageal reflux disease)   . Onychomycosis of toenail   . Leg pain   . History of recurrent UTIs    Past Surgical History  Procedure Date  . Tubal ligation   . Abdominal hysterectomy 2009    bleeding non cancer partial    reports that she has quit smoking. She does not have any smokeless tobacco history on file. She reports that she drinks alcohol. She reports that she does not use illicit drugs. family history includes Coronary artery disease in her father; Diabetes in her father and mother; Glaucoma in her mother; Hyperlipidemia in her father; Hypertension in her father and mother; and Prostate cancer in her father. No Known Allergies     Objective:   Physical Exam Physical Exam: Vital signs reviewed BMW:UXLK is a well-developed well-nourished alert cooperative  AA female who appears her stated age in no acute distress.  HEENT: normocephalic  traumatic , Eyes: PERRL EOM's  full, conjunctiva clear, Nares: paten,t no deformity discharge or tenderness., There is some mild congestion. Face nontender Ears: no deformity EAC's clear TMs with normal landmarks. Mouth: clear OP, no lesions, edema.  Moist mucous membranes. Dentition in adequate repair. NECK: supple without masses, thyromegaly or bruits. CHEST/PULM:  Clear to auscultation and percussion breath sounds equal no wheeze , rales or rhonchi. No chest wall deformities or tenderness. CV: PMI is nondisplaced, S1 S2 no gallops, murmurs, rubs. Peripheral pulses are full without delay.No JVD .  See blood pressure readings 148/98 left 140/96 right Breast: normal by inspection . No dimpling, discharge, masses, tenderness or discharge . LN: no cervical axillary inguinal adenopathy ABDOMEN: Bowel sounds normal nontender  No guard or rebound, no hepato splenomegal no CVA tenderness.  No hernia. Extremtities:  No clubbing cyanosis or edema, no acute joint swelling or redness no focal atrophy .  However behind the right heel medially there is some swelling that feels almost bony and not ballotable. No warmth or redness. No point bony tenderness. NEURO:  Oriented x3, cranial nerves 3-12 appear to be intact, no obvious focal weakness,gait within normal limits no abnormal reflexes or asymmetrical SKIN: No acute rashes normal turgor, color, no bruising or petechiae. PSYCH: Oriented, good eye contact, no obvious depression anxiety, cognition and judgment appear normal. Oriented x 3 and no noted deficits in memory, attention, and speech.     Assessment &  Plan:  Preventive Health Care UTD Counseled regarding healthy nutrition, exercise, sleep, injury prevention, calcium vit d and healthy weight .  New problems addressed:  Hypertension likley   Reports previous blood pressure elevations. She does have a family history. Discussed lifestyle interventions and medication treatments. We can begin a low dose diuretic if indeed elevated and  then follow up.  Right heel pain and swelling unclear if this is retro cocaine heel bursitis but appears to be a bony prominence. Discussed options of x-ray referral is needed.  Headaches and nasal  congestion probably has some underlying allergic rhinitis. Suggest adding Flonase control and see how she does.

## 2011-02-09 ENCOUNTER — Encounter: Payer: Self-pay | Admitting: Internal Medicine

## 2011-02-09 DIAGNOSIS — Z0000 Encounter for general adult medical examination without abnormal findings: Secondary | ICD-10-CM | POA: Insufficient documentation

## 2011-03-22 ENCOUNTER — Ambulatory Visit: Payer: BC Managed Care – PPO | Admitting: Internal Medicine

## 2011-03-22 DIAGNOSIS — Z0289 Encounter for other administrative examinations: Secondary | ICD-10-CM

## 2011-06-04 LAB — CBC
MCHC: 33.3
MCV: 72.3 — ABNORMAL LOW
MCV: 72.8 — ABNORMAL LOW
Platelets: 287
Platelets: 339
RDW: 19 — ABNORMAL HIGH
RDW: 19.2 — ABNORMAL HIGH
WBC: 15.6 — ABNORMAL HIGH

## 2011-06-04 LAB — PREGNANCY, URINE: Preg Test, Ur: NEGATIVE

## 2011-10-31 ENCOUNTER — Ambulatory Visit (INDEPENDENT_AMBULATORY_CARE_PROVIDER_SITE_OTHER)
Admission: RE | Admit: 2011-10-31 | Discharge: 2011-10-31 | Disposition: A | Discharge status: Home / Self Care | Payer: BC Managed Care – PPO | Source: Ambulatory Visit | Attending: Internal Medicine | Admitting: Internal Medicine

## 2011-10-31 ENCOUNTER — Encounter: Payer: Self-pay | Admitting: Internal Medicine

## 2011-10-31 ENCOUNTER — Ambulatory Visit (INDEPENDENT_AMBULATORY_CARE_PROVIDER_SITE_OTHER): Payer: BC Managed Care – PPO | Admitting: Internal Medicine

## 2011-10-31 VITALS — BP 120/82 | HR 66 | Temp 97.8°F | Wt 200.0 lb

## 2011-10-31 DIAGNOSIS — K5900 Constipation, unspecified: Secondary | ICD-10-CM

## 2011-10-31 DIAGNOSIS — R1031 Right lower quadrant pain: Secondary | ICD-10-CM

## 2011-10-31 DIAGNOSIS — R300 Dysuria: Secondary | ICD-10-CM

## 2011-10-31 DIAGNOSIS — R1032 Left lower quadrant pain: Secondary | ICD-10-CM

## 2011-10-31 DIAGNOSIS — Z800 Family history of malignant neoplasm of digestive organs: Secondary | ICD-10-CM

## 2011-10-31 DIAGNOSIS — R109 Unspecified abdominal pain: Secondary | ICD-10-CM

## 2011-10-31 DIAGNOSIS — R1110 Vomiting, unspecified: Secondary | ICD-10-CM

## 2011-10-31 DIAGNOSIS — Z90710 Acquired absence of both cervix and uterus: Secondary | ICD-10-CM | POA: Insufficient documentation

## 2011-10-31 LAB — CBC WITH DIFFERENTIAL/PLATELET
Basophils Absolute: 0 10*3/uL (ref 0.0–0.1)
Eosinophils Absolute: 0.2 10*3/uL (ref 0.0–0.7)
Hemoglobin: 12.2 g/dL (ref 12.0–15.0)
Lymphs Abs: 2.3 10*3/uL (ref 0.7–4.0)
MCHC: 33.7 g/dL (ref 30.0–36.0)
Neutrophils Relative %: 60.9 % (ref 43.0–77.0)
RBC: 4.04 Mil/uL (ref 3.87–5.11)
RDW: 15 % — ABNORMAL HIGH (ref 11.5–14.6)

## 2011-10-31 LAB — POCT URINALYSIS DIPSTICK
Bilirubin, UA: NEGATIVE
Blood, UA: NEGATIVE
Ketones, UA: NEGATIVE
Nitrite, UA: NEGATIVE
Protein, UA: NEGATIVE
Spec Grav, UA: 1.02
Urobilinogen, UA: 2
pH, UA: 7

## 2011-10-31 LAB — BASIC METABOLIC PANEL
BUN: 8 mg/dL (ref 6–23)
GFR: 108.82 mL/min (ref >60.00)
Potassium: 3.1 mEq/L — ABNORMAL LOW (ref 3.5–5.1)

## 2011-10-31 LAB — HEPATIC FUNCTION PANEL
Alkaline Phosphatase: 81 U/L (ref 39–117)
Bilirubin, Direct: 0.1 mg/dL (ref 0.0–0.3)
Total Bilirubin: 0.4 mg/dL (ref 0.3–1.2)

## 2011-10-31 LAB — T4, FREE: Free T4: 0.82 ng/dL (ref 0.60–1.60)

## 2011-10-31 MED ORDER — SULFAMETHOXAZOLE-TRIMETHOPRIM 800-160 MG PO TABS: 1.0000 | ORAL_TABLET | Freq: Two times a day (BID) | ORAL | Status: DC

## 2011-10-31 NOTE — Progress Notes (Signed)
Subjective:    Patient ID: Kelly Baldwin, female    DOB: 28-Aug-1966, 46 y.o.   MRN: [id number]  HPI Patient comes in today for SDA  For acute problem evaluation. 2 weeks of  Sx and now worse.  Urinary frequency and urgency and bad odeor to her urine.  NO fever and flank pain.  Has lower abd discomfort  . Cramping like a period but she had a hysterectomy for fibroids. No endometriosis. Constipation and heartburn assoicated   infequent stools.  For a while now worse   Sometimes almost a week  No blood  .uses miralax an other otc meds  Not helping.  Had vomiting  Earlier this week .  And nausea  hurts today  And declrease eating  Because "Scared o throwing up. "  HT stable  On med last labs 5 12  Review of Systems No fever cp sob cough bleeding diarrhea .  Has has ? If sinus congestion no asa    Rest as per hpi Past Medical History  Diagnosis Date  . GERD (gastroesophageal reflux disease)   . Onychomycosis of toenail   . Leg pain   . History of recurrent UTIs     History   Social History  . Marital Status: Married    Spouse Name: N/A    Number of Children: N/A  . Years of Education: N/A   Occupational History  . Not on file.   Social History Main Topics  . Smoking status: Former Games developer  . Smokeless tobacco: Not on file  . Alcohol Use: Yes  . Drug Use: No  . Sexually Active:    Other Topics Concern  . 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  No firearms. No excess sun exposure. Sees dentist regularly . No depressionNo tobacco    Past Surgical History  Procedure Date  . Tubal ligation   . Abdominal hysterectomy 2009    bleeding non cancer partial    Family History  Problem Relation Age of Onset  . Diabetes Mother     16  . Hypertension Mother   . Glaucoma Mother   .  Diabetes Father     28  . Hyperlipidemia Father   . Hypertension Father   . Coronary artery disease Father   . Prostate cancer Father     No Known Allergies  Current Outpatient Prescriptions on File Prior to Visit  Medication Sig Dispense Refill  . fluticasone (FLONASE) 50 MCG/ACT nasal spray Place 2 sprays into the nose daily.  16 g  12  . triamterene-hydrochlorothiazide (DYAZIDE) 37.5-25 MG per capsule Take 1 capsule by mouth daily.  90 capsule  1    BP 120/82  Pulse 66  Temp(Src) 97.8 F (36.6 C) (Oral)  Wt 200 lb (90.719 kg)       Objective:   Physical Exam WDWN in nad heent grossly normal.  Neck: Supple without adenopathy or masses or bruits Chest:  Clear to A&P without wheezes rales or rhonchi CV:  S1-S2 no gallops or murmurs peripheral perfusion is normal Abdomen:  Sof,t normal bowel sounds without hepatosplenomegaly, no guarding rebound or masses no CVA tenderness tender lower pelvic but not localized  No clubbing cyanosis or edema See ua 1 leuk       Assessment & Plan:  Urinary changes and dysfunction poss uti  Vs other Chronic constipation onset about age 73 .  But worse   Now worse with sx.   Family hx of colon cancer gm and polyps in father abd discomfort with vomiting  ? Related to above vs   Other cause . Get acute abd x ray and lab  rx uti and cx  Plan gi consult  Fu as appropriate. HT  Ok  On meds low dose  Ha predated?has allergy  Address above   Doesn't seem alarming at this time

## 2011-10-31 NOTE — Patient Instructions (Signed)
This could be uti but   We should get lab tests and plain abd xray to look at other causes. In the meantime use clear liquids  . We will treat for a uti and send  For culture . Will get GI to see you also

## 2011-11-03 LAB — URINE CULTURE: Colony Count: >=100000

## 2011-11-04 ENCOUNTER — Other Ambulatory Visit: Payer: Self-pay | Admitting: Internal Medicine

## 2011-11-04 DIAGNOSIS — R1110 Vomiting, unspecified: Secondary | ICD-10-CM

## 2011-11-04 MED ORDER — POTASSIUM CHLORIDE CRYS ER 20 MEQ PO TBCR: 20.0000 meq | EXTENDED_RELEASE_TABLET | Freq: Every day | ORAL | Status: DC

## 2011-11-04 NOTE — Progress Notes (Signed)
Quick Note:  Pt aware of lab results. Medication sent to pharmacy and abdominal ultrasound ordered. Pt is aware. ______

## 2011-11-05 ENCOUNTER — Telehealth: Payer: Self-pay | Admitting: *Deleted

## 2011-11-05 ENCOUNTER — Ambulatory Visit
Admission: RE | Admit: 2011-11-05 | Discharge: 2011-11-05 | Disposition: A | Discharge status: Home / Self Care | Payer: BC Managed Care – PPO | Source: Ambulatory Visit | Attending: Internal Medicine | Admitting: Internal Medicine

## 2011-11-05 ENCOUNTER — Other Ambulatory Visit: Payer: Self-pay | Admitting: Internal Medicine

## 2011-11-05 DIAGNOSIS — R1031 Right lower quadrant pain: Secondary | ICD-10-CM

## 2011-11-05 DIAGNOSIS — R1110 Vomiting, unspecified: Secondary | ICD-10-CM

## 2011-11-05 MED ORDER — PROMETHAZINE HCL 25 MG PO TABS: ORAL_TABLET | ORAL | Status: DC

## 2011-11-05 NOTE — Progress Notes (Signed)
Quick Note:  Pt aware ______ 

## 2011-11-05 NOTE — Telephone Encounter (Signed)
Her surgeon appt is 11/20/11. Pt hasn't heard from [facility] about the appt yet.

## 2011-11-05 NOTE — Progress Notes (Signed)
Quick Note:  Pt aware of results. ______ 

## 2011-11-05 NOTE — Telephone Encounter (Addendum)
GI appt is not until 11/20/2011, and would like Dr. Fabian Sharp to prescribe Rx for nausea and abd. Pain.  See radiology results please from today.

## 2011-11-05 NOTE — Telephone Encounter (Signed)
Phenergan 25mg  1 po q 4-6 hours prn nausea and vomiting #20 no refills. Call GI and surgeons about moving appts up for intermit voming and abd. Pain.  Pt aware of this.

## 2011-11-06 ENCOUNTER — Encounter: Payer: Self-pay | Admitting: Gastroenterology

## 2011-11-06 ENCOUNTER — Encounter (HOSPITAL_COMMUNITY): Payer: Self-pay | Admitting: *Deleted

## 2011-11-06 ENCOUNTER — Emergency Department (HOSPITAL_COMMUNITY): Payer: BC Managed Care – PPO

## 2011-11-06 ENCOUNTER — Telehealth: Payer: Self-pay | Admitting: Gastroenterology

## 2011-11-06 ENCOUNTER — Encounter (INDEPENDENT_AMBULATORY_CARE_PROVIDER_SITE_OTHER): Payer: Self-pay | Admitting: Surgery

## 2011-11-06 ENCOUNTER — Inpatient Hospital Stay (HOSPITAL_COMMUNITY)
Admission: EM | Admit: 2011-11-06 | Discharge: 2011-11-13 | DRG: 172 | Disposition: A | Discharge status: Home / Self Care | Payer: BC Managed Care – PPO | Source: Ambulatory Visit | Attending: Family Medicine | Admitting: Family Medicine

## 2011-11-06 ENCOUNTER — Ambulatory Visit (INDEPENDENT_AMBULATORY_CARE_PROVIDER_SITE_OTHER): Payer: BC Managed Care – PPO | Admitting: Gastroenterology

## 2011-11-06 ENCOUNTER — Other Ambulatory Visit: Payer: Self-pay

## 2011-11-06 ENCOUNTER — Telehealth (INDEPENDENT_AMBULATORY_CARE_PROVIDER_SITE_OTHER): Payer: Self-pay | Admitting: General Surgery

## 2011-11-06 DIAGNOSIS — J309 Allergic rhinitis, unspecified: Secondary | ICD-10-CM

## 2011-11-06 DIAGNOSIS — B351 Tinea unguium: Secondary | ICD-10-CM

## 2011-11-06 DIAGNOSIS — K315 Obstruction of duodenum: Secondary | ICD-10-CM

## 2011-11-06 DIAGNOSIS — N390 Urinary tract infection, site not specified: Secondary | ICD-10-CM

## 2011-11-06 DIAGNOSIS — C7952 Secondary malignant neoplasm of bone marrow: Secondary | ICD-10-CM | POA: Diagnosis present

## 2011-11-06 DIAGNOSIS — Z79899 Other long term (current) drug therapy: Secondary | ICD-10-CM

## 2011-11-06 DIAGNOSIS — D509 Iron deficiency anemia, unspecified: Secondary | ICD-10-CM

## 2011-11-06 DIAGNOSIS — M545 Low back pain, unspecified: Secondary | ICD-10-CM

## 2011-11-06 DIAGNOSIS — R0600 Dyspnea, unspecified: Secondary | ICD-10-CM

## 2011-11-06 DIAGNOSIS — R300 Dysuria: Secondary | ICD-10-CM

## 2011-11-06 DIAGNOSIS — M79671 Pain in right foot: Secondary | ICD-10-CM

## 2011-11-06 DIAGNOSIS — E860 Dehydration: Secondary | ICD-10-CM | POA: Diagnosis present

## 2011-11-06 DIAGNOSIS — E876 Hypokalemia: Secondary | ICD-10-CM

## 2011-11-06 DIAGNOSIS — K219 Gastro-esophageal reflux disease without esophagitis: Secondary | ICD-10-CM

## 2011-11-06 DIAGNOSIS — K8020 Calculus of gallbladder without cholecystitis without obstruction: Secondary | ICD-10-CM | POA: Diagnosis present

## 2011-11-06 DIAGNOSIS — R030 Elevated blood-pressure reading, without diagnosis of hypertension: Secondary | ICD-10-CM

## 2011-11-06 DIAGNOSIS — Z87440 Personal history of urinary (tract) infections: Secondary | ICD-10-CM

## 2011-11-06 DIAGNOSIS — Z0000 Encounter for general adult medical examination without abnormal findings: Secondary | ICD-10-CM

## 2011-11-06 DIAGNOSIS — E669 Obesity, unspecified: Secondary | ICD-10-CM

## 2011-11-06 DIAGNOSIS — K5900 Constipation, unspecified: Secondary | ICD-10-CM

## 2011-11-06 DIAGNOSIS — R1031 Right lower quadrant pain: Secondary | ICD-10-CM | POA: Insufficient documentation

## 2011-11-06 DIAGNOSIS — E871 Hypo-osmolality and hyponatremia: Secondary | ICD-10-CM

## 2011-11-06 DIAGNOSIS — M549 Dorsalgia, unspecified: Secondary | ICD-10-CM

## 2011-11-06 DIAGNOSIS — C170 Malignant neoplasm of duodenum: Secondary | ICD-10-CM

## 2011-11-06 DIAGNOSIS — C799 Secondary malignant neoplasm of unspecified site: Secondary | ICD-10-CM | POA: Diagnosis present

## 2011-11-06 DIAGNOSIS — R5381 Other malaise: Secondary | ICD-10-CM

## 2011-11-06 DIAGNOSIS — A5901 Trichomonal vulvovaginitis: Secondary | ICD-10-CM

## 2011-11-06 DIAGNOSIS — R109 Unspecified abdominal pain: Secondary | ICD-10-CM | POA: Diagnosis present

## 2011-11-06 DIAGNOSIS — C7951 Secondary malignant neoplasm of bone: Secondary | ICD-10-CM | POA: Diagnosis present

## 2011-11-06 DIAGNOSIS — G5600 Carpal tunnel syndrome, unspecified upper limb: Secondary | ICD-10-CM

## 2011-11-06 DIAGNOSIS — M79609 Pain in unspecified limb: Secondary | ICD-10-CM

## 2011-11-06 DIAGNOSIS — K56609 Unspecified intestinal obstruction, unspecified as to partial versus complete obstruction: Secondary | ICD-10-CM | POA: Diagnosis present

## 2011-11-06 DIAGNOSIS — R1110 Vomiting, unspecified: Secondary | ICD-10-CM

## 2011-11-06 DIAGNOSIS — C801 Malignant (primary) neoplasm, unspecified: Secondary | ICD-10-CM

## 2011-11-06 DIAGNOSIS — R51 Headache: Secondary | ICD-10-CM

## 2011-11-06 DIAGNOSIS — K56600 Partial intestinal obstruction, unspecified as to cause: Secondary | ICD-10-CM | POA: Diagnosis present

## 2011-11-06 DIAGNOSIS — E663 Overweight: Secondary | ICD-10-CM

## 2011-11-06 DIAGNOSIS — Z90710 Acquired absence of both cervix and uterus: Secondary | ICD-10-CM

## 2011-11-06 HISTORY — DX: Shortness of breath: R06.02

## 2011-11-06 LAB — WET PREP, GENITAL: Yeast Wet Prep HPF POC: NONE SEEN

## 2011-11-06 LAB — DIFFERENTIAL
Eosinophils Relative: 2 % (ref 0–5)
Lymphocytes Relative: 23 % (ref 12–46)
Lymphs Abs: 2.6 10*3/uL (ref 0.7–4.0)
Monocytes Relative: 6 % (ref 3–12)

## 2011-11-06 LAB — COMPREHENSIVE METABOLIC PANEL
ALT: 18 U/L (ref 0–35)
Alkaline Phosphatase: 112 U/L (ref 39–117)
BUN: 20 mg/dL (ref 6–23)
CO2: 26 mEq/L (ref 19–32)
Calcium: 10.7 mg/dL — ABNORMAL HIGH (ref 8.4–10.5)
GFR calc Af Amer: 66 mL/min — ABNORMAL LOW (ref >90)
GFR calc non Af Amer: 57 mL/min — ABNORMAL LOW (ref >90)
Glucose, Bld: 121 mg/dL — ABNORMAL HIGH (ref 70–99)
Sodium: 133 mEq/L — ABNORMAL LOW (ref 135–145)

## 2011-11-06 LAB — URINALYSIS, ROUTINE W REFLEX MICROSCOPIC
Glucose, UA: NEGATIVE mg/dL
pH: 6 (ref 5.0–8.0)

## 2011-11-06 LAB — CBC
HCT: 41.2 % (ref 36.0–46.0)
Hemoglobin: 15 g/dL (ref 12.0–15.0)
MCV: 81.3 fL (ref 78.0–100.0)
Platelets: 359 10*3/uL (ref 150–400)
RBC: 5.07 MIL/uL (ref 3.87–5.11)
WBC: 11.5 10*3/uL — ABNORMAL HIGH (ref 4.0–10.5)

## 2011-11-06 LAB — URINE MICROSCOPIC-ADD ON

## 2011-11-06 MED ORDER — HYDROCODONE-ACETAMINOPHEN 5-500 MG PO TABS: 1.0000 | ORAL_TABLET | Freq: Four times a day (QID) | ORAL | Status: DC | PRN

## 2011-11-06 MED ORDER — PEG-KCL-NACL-NASULF-NA ASC-C 100 G PO SOLR: 1.0000 | Freq: Once | ORAL | Status: DC

## 2011-11-06 MED ORDER — SODIUM CHLORIDE 0.9 % IV BOLUS (SEPSIS)
1000.0000 mL | Freq: Once | INTRAVENOUS | Status: AC
  Administered 2011-11-06: 1000 mL via INTRAVENOUS

## 2011-11-06 MED ORDER — ONDANSETRON 4 MG PO TBDP: ORAL_TABLET | ORAL | Status: DC

## 2011-11-06 MED ORDER — IOHEXOL 300 MG/ML  SOLN
20.0000 mL | INTRAMUSCULAR | Status: DC
  Administered 2011-11-06: 20 mL via ORAL

## 2011-11-06 MED ORDER — MORPHINE SULFATE 4 MG/ML IJ SOLN
6.0000 mg | Freq: Once | INTRAMUSCULAR | Status: AC
  Administered 2011-11-06: 6 mg via INTRAVENOUS
  Filled 2011-11-06: qty 2

## 2011-11-06 MED ORDER — ONDANSETRON HCL 4 MG/2ML IJ SOLN
4.0000 mg | Freq: Once | INTRAMUSCULAR | Status: AC
  Administered 2011-11-06: 4 mg via INTRAVENOUS
  Filled 2011-11-06: qty 2

## 2011-11-06 NOTE — ED Notes (Signed)
Reports having RLQ pain x 3 weeks, radiates around to right flank, denies urinary symptoms. Having constipation, very small bowel movements over past 2.5 weeks. Pain gets worse after eating and has n/v.

## 2011-11-06 NOTE — Telephone Encounter (Signed)
Per French Ana at the surgeon's office the best they can offer the pt is to have her go to the ED and be evaluated. Then have them call the surgeon on call. I called Kelly Baldwin is going to take a message have someone call back about this.

## 2011-11-06 NOTE — Progress Notes (Signed)
History of Present Illness: Kelly Baldwin is a 46 year old [employer] female referred at the request of Dr. Fabian Sharp for evaluation of abdominal pain. For the past 3 weeks she has been complaining of severe right lower quadrant pain with radiation to the back. Coincident with this she's not had a bowel movement. Her last bowel movement was approximately one week prior to the onset of pain. She suffers from chronic constipation  having a bowel movement perhaps once a week.  She normally does not have pain. Rarely has she seen blood when she moves her bowels. Abdominal ultrasound, which I reviewed, demonstrated gallbladder stones but no evidence for gallbladder wall thickening or pericholecystic fluid. Plain film of the abdomen showed a normal gas pattern.   Past Medical History  Diagnosis Date  . GERD (gastroesophageal reflux disease)   . Onychomycosis of toenail   . Leg pain   . History of recurrent UTIs   . Hx of hysterectomy for benign disease     partial   Past Surgical History  Procedure Date  . Tubal ligation   . Abdominal hysterectomy 2009    bleeding non cancer partial fibrods   family history includes Colon cancer in her paternal grandfather; Coronary artery disease in her father; Diabetes in her father and mother; Glaucoma in her mother; Hyperlipidemia in her father; Hypertension in her father and mother; and Prostate cancer in her father. Current Outpatient Prescriptions  Medication Sig Dispense Refill  . fluticasone (FLONASE) 50 MCG/ACT nasal spray Place 2 sprays into the nose daily.  16 g  12  . potassium chloride SA (K-DUR,KLOR-CON) 20 MEQ tablet Take 1 tablet (20 mEq total) by mouth daily.  30 tablet  1  . promethazine (PHENERGAN) 25 MG tablet 1 every 4-6 hours as need for nausea and vomiting.  20 tablet  0  . sulfamethoxazole-trimethoprim (SEPTRA DS) 800-160 MG per tablet Take 1 tablet by mouth 2 (two) times daily.  6 tablet  0  . triamterene-hydrochlorothiazide (DYAZIDE) 37.5-25  MG per capsule Take 1 capsule by mouth daily.  90 capsule  1   Allergies as of 11/06/2011  . (No Known Allergies)    reports that she has quit smoking. She has never used smokeless tobacco. She reports that she does not drink alcohol or use illicit drugs.     Review of Systems: Pertinent positive and negative review of systems were noted in the above HPI section. All other review of systems were otherwise negative.  Vital signs were reviewed in today's medical record Physical Exam: General: Well developed , well nourished, no acute distress Head: Normocephalic and atraumatic Eyes:  sclerae anicteric, EOMI Ears: Normal auditory acuity Mouth: No deformity or lesions Neck: Supple, no masses or thyromegaly Lungs: Clear throughout to auscultation Heart: Regular rate and rhythm; no murmurs, rubs or bruits Abdomen: Soft, non tender and non distended. No masses, hepatosplenomegaly or hernias noted. Normal Bowel sounds Rectal:deferred Musculoskeletal: Symmetrical with no gross deformities  Skin: No lesions on visible extremities Pulses:  Normal pulses noted Extremities: No clubbing, cyanosis, edema or deformities noted Neurological: Alert oriented x 4, grossly nonfocal Cervical Nodes:  No significant cervical adenopathy Inguinal Nodes: No significant inguinal adenopathy Psychological:  Alert and cooperative. Normal mood and affect

## 2011-11-06 NOTE — Assessment & Plan Note (Signed)
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 A structural abnormality of the colon should be ruled out, however.  Recommendations #1 colonoscopy

## 2011-11-06 NOTE — ED Provider Notes (Signed)
History     CSN: [id number]  Arrival date & time 11/06/11  1826   First MD Initiated Contact with Patient 11/06/11 2103      Chief Complaint  Patient presents with  . Abdominal Pain  . Flank Pain    (Consider location/radiation/quality/duration/timing/severity/associated sxs/prior treatment) Patient is a 46 y.o. female presenting with abdominal pain. The history is provided by the patient.  Abdominal Pain The primary symptoms of the illness include abdominal pain. The current episode started more than 2 days ago (3 weeks ago.). The onset of the illness was gradual. The problem has been gradually worsening.  Associated with: Associated with eating. The patient states that she believes she is currently not pregnant. The patient has had a change in bowel habit (Patient endorses constipation with bowel movements roughly once a week.). Additional symptoms associated with the illness include constipation. Symptoms associated with the illness do not include chills, anorexia, urgency or frequency.    Past Medical History  Diagnosis Date  . GERD (gastroesophageal reflux disease)   . Onychomycosis of toenail   . Leg pain   . History of recurrent UTIs   . Hx of hysterectomy for benign disease     partial    Past Surgical History  Procedure Date  . Tubal ligation   . Abdominal hysterectomy 2009    bleeding non cancer partial fibrods    Family History  Problem Relation Age of Onset  . Diabetes Mother     62  . Hypertension Mother   . Glaucoma Mother   . Diabetes Father     75  . Hyperlipidemia Father   . Hypertension Father   . Coronary artery disease Father   . Prostate cancer Father   . Colon cancer Paternal Grandfather     GF paternal father has polyps    History  Substance Use Topics  . Smoking status: Former Games developer  . Smokeless tobacco: Never Used  . Alcohol Use: No    OB History    Grav Para Term Preterm Abortions TAB SAB Ect Mult Living                   Review of Systems  Constitutional: Negative for chills.  Gastrointestinal: Positive for abdominal pain and constipation. Negative for anorexia.  Genitourinary: Negative for urgency and frequency.  All other systems reviewed and are negative.    Allergies  Review of patient's allergies indicates no known allergies.  Home Medications   Current Outpatient Rx  Name Route Sig Dispense Refill  . POTASSIUM CHLORIDE CRYS ER 20 MEQ PO TBCR Oral Take 20 mEq by mouth daily.    Marland Kitchen PROMETHAZINE HCL 25 MG PO TABS Oral Take 25 mg by mouth every 6 (six) hours as needed. For nausea    . TRIAMTERENE-HCTZ 37.5-25 MG PO CAPS Oral Take 1 capsule by mouth daily.    . SULFAMETHOXAZOLE-TRIMETHOPRIM 800-160 MG PO TABS Oral Take 1 tablet by mouth 2 (two) times daily.      BP 131/89  Pulse 120  Temp(Src) 98.9 F (37.2 C) (Oral)  Resp 16  SpO2 100%  Physical Exam  Constitutional: She is oriented to person, place, and time. She appears well-developed and well-nourished. No distress.  HENT:  Head: Normocephalic and atraumatic.  Mouth/Throat: Oropharynx is clear and moist.  Eyes: EOM are normal. Pupils are equal, round, and reactive to light.  Neck: Normal range of motion. Neck supple.  Cardiovascular: Regular rhythm and normal heart sounds.  Exam reveals  no friction rub.   No murmur heard.      Tachycardic.  Pulmonary/Chest: Effort normal and breath sounds normal. No respiratory distress. She has no wheezes. She has no rales.  Abdominal: Soft. She exhibits no mass. There is tenderness. There is no rebound and no guarding.    Genitourinary: Vagina normal.       Small amount of milky Chicas discharge in vaginal vault. No lacerations or abrasions. Absent cervix and uterus. Mild tenderness in the right adnexa without fullness. Left adnexa is nontender.  Musculoskeletal: Normal range of motion. She exhibits no edema and no tenderness.  Lymphadenopathy:    She has no cervical adenopathy.   Neurological: She is alert and oriented to person, place, and time.  Skin: Skin is warm and dry. No rash noted.  Psychiatric: She has a normal mood and affect. Her behavior is normal.    ED Course  Procedures (including critical care time)  Results for orders placed during the hospital encounter of 11/06/11  CBC      Component Value Range   WBC 11.5 (*) 4.0 - 10.5 (K/uL)   RBC 5.07  3.87 - 5.11 (MIL/uL)   Hemoglobin 15.0  12.0 - 15.0 (g/dL)   HCT 16.1  09.6 - 04.5 (%)   MCV 81.3  78.0 - 100.0 (fL)   MCH 29.6  26.0 - 34.0 (pg)   MCHC 36.4 (*) 30.0 - 36.0 (g/dL)   RDW 40.9  81.1 - 91.4 (%)   Platelets 359  150 - 400 (K/uL)  DIFFERENTIAL      Component Value Range   Neutrophils Relative 68  43 - 77 (%)   Neutro Abs 7.9 (*) 1.7 - 7.7 (K/uL)   Lymphocytes Relative 23  12 - 46 (%)   Lymphs Abs 2.6  0.7 - 4.0 (K/uL)   Monocytes Relative 6  3 - 12 (%)   Monocytes Absolute 0.7  0.1 - 1.0 (K/uL)   Eosinophils Relative 2  0 - 5 (%)   Eosinophils Absolute 0.3  0.0 - 0.7 (K/uL)   Basophils Relative 0  0 - 1 (%)   Basophils Absolute 0.1  0.0 - 0.1 (K/uL)  COMPREHENSIVE METABOLIC PANEL      Component Value Range   Sodium 133 (*) 135 - 145 (mEq/L)   Potassium 3.4 (*) 3.5 - 5.1 (mEq/L)   Chloride 95 (*) 96 - 112 (mEq/L)   CO2 26  19 - 32 (mEq/L)   Glucose, Bld 121 (*) 70 - 99 (mg/dL)   BUN 20  6 - 23 (mg/dL)   Creatinine, Ser 7.82 (*) 0.50 - 1.10 (mg/dL)   Calcium 95.6 (*) 8.4 - 10.5 (mg/dL)   Total Protein 8.5 (*) 6.0 - 8.3 (g/dL)   Albumin 4.1  3.5 - 5.2 (g/dL)   AST 17  0 - 37 (U/L)   ALT 18  0 - 35 (U/L)   Alkaline Phosphatase 112  39 - 117 (U/L)   Total Bilirubin 0.8  0.3 - 1.2 (mg/dL)   GFR calc non Af Amer 57 (*) >90 (mL/min)   GFR calc Af Amer 66 (*) >90 (mL/min)  URINALYSIS, ROUTINE W REFLEX MICROSCOPIC      Component Value Range   Color, Urine YELLOW  YELLOW    APPearance CLOUDY (*) CLEAR    Specific Gravity, Urine 1.025  1.005 - 1.030    pH 6.0  5.0 - 8.0     Glucose, UA NEGATIVE  NEGATIVE (mg/dL)   Hgb urine dipstick  NEGATIVE  NEGATIVE    Bilirubin Urine NEGATIVE  NEGATIVE    Ketones, ur NEGATIVE  NEGATIVE (mg/dL)   Protein, ur NEGATIVE  NEGATIVE (mg/dL)   Urobilinogen, UA 0.2  0.0 - 1.0 (mg/dL)   Nitrite NEGATIVE  NEGATIVE    Leukocytes, UA SMALL (*) NEGATIVE   WET PREP, GENITAL      Component Value Range   Yeast Wet Prep HPF POC NONE SEEN  NONE SEEN    Trich, Wet Prep FEW (*) NONE SEEN    Clue Cells Wet Prep HPF POC FEW (*) NONE SEEN    WBC, Wet Prep HPF POC FEW (*) NONE SEEN   D-DIMER, QUANTITATIVE      Component Value Range   D-Dimer, Quant 0.69 (*) 0.00 - 0.48 (ug/mL-FEU)  URINE MICROSCOPIC-ADD ON      Component Value Range   Squamous Epithelial / LPF MANY (*) RARE    WBC, UA 3-6  <3 (WBC/hpf)   RBC / HPF 0-2  <3 (RBC/hpf)   Bacteria, UA FEW (*) RARE    Urine-Other MUCOUS PRESENT      US Abdomen Complete  11/05/2011  *RADIOLOGY REPORT*  Clinical Data:  Abdominal pain and vomiting  ABDOMINAL ULTRASOUND COMPLETE  Comparison:  None.  Findings:  Gallbladder: There is a 2.4 cm mobile gallstone within the gallbladder lumen.  No gallbladder wall thickening or pericholecystic fluid. Negative sonographic Murphy's sign.  Common Bile Duct:  Within normal limits in caliber. Measures 3.8 mm.  Liver: No focal mass lesion identified. There is mild, diffuse fatty infiltration.  IVC:  Appears normal.  Pancreas:  No abnormality identified.  Spleen:  Within normal limits in size and echotexture.  Right kidney:  Normal in size and parenchymal echogenicity.  No evidence of mass or hydronephrosis.  Left kidney:  Normal in size and parenchymal echogenicity.  No evidence of mass or hydronephrosis.  Abdominal Aorta:  No aneurysm identified.  IMPRESSION: Cholelithiasis without sonographic evidence of cholecystitis.  Original Report Authenticated By: Brandon Melnick, M.D.    Date: 11/06/2011  Rate: 96   Rhythm: normal sinus rhythm  QRS Axis: normal  Intervals:  normal  ST/T Wave abnormalities: early repolarization  Conduction Disutrbances:none  Narrative Interpretation:   Old EKG Reviewed: NSR    1. Abdominal pain   2. Chest pain   3. Dyspnea   4. Nausea and vomiting       MDM  32:73 PM 46 year old female with a history of hypertension presenting with a 3 week history of gradually progressive right lower quadrant pain radiating into her right flank. She states the pain is waxing and waning, but never goes away completely. The pain is worse with eating and states it keeps her up at night. She endorses 3-4 episodes of vomiting per day for the past 3 weeks, and also states she has been constipated with about one bowel movement every week. She is status post partial hysterectomy but still has her ovaries. She denies any other abdominal surgeries. She has been evaluated by her primary physician. She was recently diagnosed with a UTI which grew out Escherichia coli that was pansensitive. She had an acute abdominal series done 6 days ago which was normal, and a complete abdominal ultrasound performed yesterday which showed an asymptomatic gallstone but was otherwise normal including normal kidneys bilaterally. She denies any urinary symptoms. She endorses subjective fever 2 days ago. She is afebrile but noted to be tachycardic around 120. She has tenderness in her right lower quadrant  going up and over right upper quadrant without guarding or rebound. Will check CBC, CMP, urinalysis with culture, and give normal saline bolus. She does endorse chest pain and dyspnea worse with exertion for the past 4 days, so will check EKG and chest x-ray. Given that she does have this pain in her right upper quadrant in the setting of tachycardia, chest pain shortness of breath PE is on the differential so we'll check a d-dimer.  11:15 PM patient with right adnexal tenderness on pelvic exam. D-dimer is positive so will check CTA that he chest.  12:29 AM labs reveal a mild  leukocytosis of 11,000. They're otherwise without significant abnormality. Her heart rate is improved on reevaluation. Care transferred to Dr. Terressa Koyanagi with plans to followup abdominal and chest CT. If workup is negative will plan to discharge with outpatient surgery followup.    Sheran Luz, MD 11/07/11 [phone]

## 2011-11-06 NOTE — ED Notes (Signed)
MD at bedside. 

## 2011-11-06 NOTE — Telephone Encounter (Signed)
Pt is having vomiting, abdominal pain, and has not had a bowel movement in several weeks. Dr. Rosezella [place] office requesting pt be seen asap. Pt scheduled to see Dr. Arlyce Dice today at 1:45pm. Carollee Herter to notify pt of appt date and time. Records in epic.

## 2011-11-06 NOTE — Assessment & Plan Note (Signed)
I strongly suspect that abdominal pain is related to chronic constipation and obstipation. Although gall bladder stones are present there is no radiographic evidence for acute cholecystitis or chronic cholecystitis and pain is too low in the abdomen to render cholecystitis likely.  Recommendations #1 Moviprep one half dose for alleviation of constipation and obstipation

## 2011-11-06 NOTE — Telephone Encounter (Signed)
Received a call from Shanna/Dr Panosh office wanting to get the patient in sooner for n/v and abdominal pain due to gallbladder. Explained this is not something we see in urgent clinic and if she is presenting with these symptoms she needs to go to the ED for evaluation and we could call in a [facility] Dr for evaluation.

## 2011-11-06 NOTE — Patient Instructions (Signed)
Your Colonoscopy is scheduled on 11/12/2011 at 3:30pm  You have been given separate instructions We have given you a SuPrep sample kit, you will need to take the 1/2 dose tonight to purge your bowels for the constipation You can pick up your MoviPrep kit from your pharmacy  Colonoscopy A colonoscopy is an exam to evaluate your entire colon. In this exam, your colon is cleansed. A long fiberoptic tube is inserted through your rectum and into your colon. The fiberoptic scope (endoscope) is a long bundle of enclosed and very flexible fibers. These fibers transmit light to the area examined and send images from that area to your caregiver. Discomfort is usually minimal. You may be given a drug to help you sleep (sedative) during or prior to the procedure. This exam helps to detect lumps (tumors), polyps, inflammation, and areas of bleeding. Your caregiver may also take a small piece of tissue (biopsy) that will be examined under a microscope. LET YOUR CAREGIVER KNOW ABOUT:   Allergies to food or medicine.   Medicines taken, including vitamins, herbs, eyedrops, over-the-counter medicines, and creams.   Use of steroids (by mouth or creams).   Previous problems with anesthetics or numbing medicines.   History of bleeding problems or blood clots.   Previous surgery.   Other health problems, including diabetes and kidney problems.   Possibility of pregnancy, if this applies.  BEFORE THE PROCEDURE   A clear liquid diet may be required for 2 days before the exam.   Ask your caregiver about changing or stopping your regular medications.   Liquid injections (enemas) or laxatives may be required.   A large amount of electrolyte solution may be given to you to drink over a short period of time. This solution is used to clean out your colon.   You should be present 60 minutes prior to your procedure or as directed by your caregiver.  AFTER THE PROCEDURE   If you received a sedative or pain  relieving medication, you will need to arrange for someone to drive you home.   Occasionally, there is a little blood passed with the first bowel movement. Do not be concerned.  FINDING OUT THE RESULTS OF YOUR TEST Not all test results are available during your visit. If your test results are not back during the visit, make an appointment with your caregiver to find out the results. Do not assume everything is normal if you have not heard from your caregiver or the medical facility. It is important for you to follow up on all of your test results. HOME CARE INSTRUCTIONS   It is not unusual to pass moderate amounts of gas and experience mild abdominal cramping following the procedure. This is due to air being used to inflate your colon during the exam. Walking or a warm pack on your belly (abdomen) may help.   You may resume all normal meals and activities after sedatives and medicines have worn off.   Only take over-the-counter or prescription medicines for pain, discomfort, or fever as directed by your caregiver. Do not use aspirin or blood thinners if a biopsy was taken. Consult your caregiver for medicine usage if biopsies were taken.  SEEK IMMEDIATE MEDICAL CARE IF:   You have a fever.   You pass large blood clots or fill a toilet with blood following the procedure. This may also occur 10 to 14 days following the procedure. This is more likely if a biopsy was taken.   You develop abdominal pain  that keeps getting worse and cannot be relieved with medicine.  Document Released: 08/23/2000 Document Revised: 05/08/2011 Document Reviewed: 04/07/2008 [facility] Patient Information 2012 [place], [place].

## 2011-11-06 NOTE — ED Notes (Signed)
Pt ambulated to and from RR for CCUS.

## 2011-11-06 NOTE — ED Provider Notes (Signed)
11:41 PM  I performed a history and physical examination of Kelly Baldwin and discussed her management with Dr. Fayrene Fearing.  I agree with the history, physical, assessment, and plan of care, with the following exceptions: None  The patient is awake, alert, and oriented appropriately in no apparent distress. She has no jaundice or scleral icterus. She is breathing easily and without respiratory distress. Skin is warm and dry and peripheral pulses are full and palpable. Her abdomen is nondistended, soft to palpation, with mild tenderness at the right lower quadrant without rebound or guarding. The patient does not have right upper quadrant tenderness. Bowel sounds are normal.  I was present for the following procedures: None Time Spent in Critical Care of the patient: None Time spent in discussions with the patient and family: 10 minutes  Ajahni Nay D    Felisa Bonier, MD 11/06/11 2344

## 2011-11-07 ENCOUNTER — Emergency Department (HOSPITAL_COMMUNITY): Payer: BC Managed Care – PPO

## 2011-11-07 ENCOUNTER — Encounter (HOSPITAL_COMMUNITY)
Admission: EM | Disposition: A | Discharge status: Home / Self Care | Payer: Self-pay | Source: Ambulatory Visit | Attending: Internal Medicine

## 2011-11-07 ENCOUNTER — Encounter (HOSPITAL_COMMUNITY): Payer: Self-pay | Admitting: Internal Medicine

## 2011-11-07 ENCOUNTER — Other Ambulatory Visit: Payer: Self-pay

## 2011-11-07 DIAGNOSIS — C799 Secondary malignant neoplasm of unspecified site: Secondary | ICD-10-CM | POA: Diagnosis present

## 2011-11-07 DIAGNOSIS — E860 Dehydration: Secondary | ICD-10-CM | POA: Diagnosis present

## 2011-11-07 DIAGNOSIS — N390 Urinary tract infection, site not specified: Secondary | ICD-10-CM | POA: Diagnosis present

## 2011-11-07 DIAGNOSIS — K315 Obstruction of duodenum: Secondary | ICD-10-CM

## 2011-11-07 DIAGNOSIS — A5901 Trichomonal vulvovaginitis: Secondary | ICD-10-CM | POA: Diagnosis present

## 2011-11-07 DIAGNOSIS — E871 Hypo-osmolality and hyponatremia: Secondary | ICD-10-CM | POA: Diagnosis present

## 2011-11-07 DIAGNOSIS — E876 Hypokalemia: Secondary | ICD-10-CM | POA: Diagnosis present

## 2011-11-07 DIAGNOSIS — K8020 Calculus of gallbladder without cholecystitis without obstruction: Secondary | ICD-10-CM | POA: Diagnosis present

## 2011-11-07 DIAGNOSIS — K56600 Partial intestinal obstruction, unspecified as to cause: Secondary | ICD-10-CM | POA: Diagnosis present

## 2011-11-07 DIAGNOSIS — C801 Malignant (primary) neoplasm, unspecified: Secondary | ICD-10-CM

## 2011-11-07 HISTORY — PX: ENTEROSCOPY: SHX5533

## 2011-11-07 LAB — COMPREHENSIVE METABOLIC PANEL
AST: 16 U/L (ref 0–37)
Albumin: 3.7 g/dL (ref 3.5–5.2)
Chloride: 98 mEq/L (ref 96–112)
Creatinine, Ser: 0.96 mg/dL (ref 0.50–1.10)
Potassium: 3 mEq/L — ABNORMAL LOW (ref 3.5–5.1)
Total Bilirubin: 1.1 mg/dL (ref 0.3–1.2)
Total Protein: 7.8 g/dL (ref 6.0–8.3)

## 2011-11-07 LAB — CBC
HCT: 37 % (ref 36.0–46.0)
MCH: 28.9 pg (ref 26.0–34.0)
MCV: 81.1 fL (ref 78.0–100.0)
Platelets: 337 10*3/uL (ref 150–400)
RBC: 4.56 MIL/uL (ref 3.87–5.11)

## 2011-11-07 SURGERY — EGD (ESOPHAGOGASTRODUODENOSCOPY)
Anesthesia: Moderate Sedation

## 2011-11-07 SURGERY — ENTEROSCOPY
Anesthesia: Moderate Sedation

## 2011-11-07 MED ORDER — SODIUM CHLORIDE 0.9 % IJ SOLN
3.0000 mL | Freq: Two times a day (BID) | INTRAMUSCULAR | Status: DC
  Administered 2011-11-07 – 2011-11-13 (×7): 3 mL via INTRAVENOUS

## 2011-11-07 MED ORDER — POTASSIUM CHLORIDE 10 MEQ/100ML IV SOLN
10.0000 meq | INTRAVENOUS | Status: AC
  Administered 2011-11-07 (×2): 10 meq via INTRAVENOUS
  Filled 2011-11-07 (×4): qty 100

## 2011-11-07 MED ORDER — MIDAZOLAM HCL 10 MG/2ML IJ SOLN
INTRAMUSCULAR | Status: AC
  Filled 2011-11-07: qty 2

## 2011-11-07 MED ORDER — ALBUTEROL SULFATE (5 MG/ML) 0.5% IN NEBU: 2.5000 mg | INHALATION_SOLUTION | RESPIRATORY_TRACT | Status: DC | PRN

## 2011-11-07 MED ORDER — SODIUM CHLORIDE 0.9 % IV SOLN: Freq: Once | INTRAVENOUS | Status: DC

## 2011-11-07 MED ORDER — MIDAZOLAM HCL 10 MG/2ML IJ SOLN
INTRAMUSCULAR | Status: DC | PRN
  Administered 2011-11-07 (×3): 2 mg via INTRAVENOUS

## 2011-11-07 MED ORDER — ZOLPIDEM TARTRATE 5 MG PO TABS
10.0000 mg | ORAL_TABLET | Freq: Every evening | ORAL | Status: DC | PRN
  Administered 2011-11-07 – 2011-11-11 (×5): 10 mg via ORAL
  Filled 2011-11-07: qty 2
  Filled 2011-11-07: qty 1
  Filled 2011-11-07: qty 2
  Filled 2011-11-07: qty 1
  Filled 2011-11-07 (×2): qty 2

## 2011-11-07 MED ORDER — MORPHINE SULFATE 2 MG/ML IJ SOLN: 2.0000 mg | INTRAMUSCULAR | Status: DC | PRN

## 2011-11-07 MED ORDER — KCL IN DEXTROSE-NACL 20-5-0.9 MEQ/L-%-% IV SOLN
INTRAVENOUS | Status: DC
  Administered 2011-11-09: 100 mL via INTRAVENOUS
  Filled 2011-11-07 (×10): qty 1000

## 2011-11-07 MED ORDER — ACETAMINOPHEN 650 MG RE SUPP: 650.0000 mg | Freq: Four times a day (QID) | RECTAL | Status: DC | PRN

## 2011-11-07 MED ORDER — ONDANSETRON HCL 4 MG PO TABS: 4.0000 mg | ORAL_TABLET | Freq: Four times a day (QID) | ORAL | Status: DC | PRN

## 2011-11-07 MED ORDER — CEFTRIAXONE SODIUM 1 G IJ SOLR
1.0000 g | INTRAMUSCULAR | Status: DC
  Administered 2011-11-07 – 2011-11-11 (×4): 1 g via INTRAVENOUS
  Filled 2011-11-07 (×7): qty 10

## 2011-11-07 MED ORDER — FENTANYL CITRATE 0.05 MG/ML IJ SOLN
INTRAMUSCULAR | Status: AC
  Filled 2011-11-07: qty 2

## 2011-11-07 MED ORDER — ACETAMINOPHEN 325 MG PO TABS
650.0000 mg | ORAL_TABLET | Freq: Four times a day (QID) | ORAL | Status: DC | PRN
  Administered 2011-11-07 – 2011-11-13 (×4): 650 mg via ORAL
  Filled 2011-11-07 (×4): qty 2

## 2011-11-07 MED ORDER — IOHEXOL 300 MG/ML  SOLN
100.0000 mL | Freq: Once | INTRAMUSCULAR | Status: AC | PRN
  Administered 2011-11-07: 100 mL via INTRAVENOUS

## 2011-11-07 MED ORDER — METRONIDAZOLE IN NACL 5-0.79 MG/ML-% IV SOLN
500.0000 mg | Freq: Three times a day (TID) | INTRAVENOUS | Status: DC
  Administered 2011-11-07 – 2011-11-09 (×7): 500 mg via INTRAVENOUS
  Filled 2011-11-07 (×9): qty 100

## 2011-11-07 MED ORDER — POTASSIUM CHLORIDE IN NACL 20-0.9 MEQ/L-% IV SOLN
INTRAVENOUS | Status: DC
  Administered 2011-11-07: 07:00:00 via INTRAVENOUS
  Filled 2011-11-07 (×3): qty 1000

## 2011-11-07 MED ORDER — FENTANYL CITRATE 0.05 MG/ML IJ SOLN
INTRAMUSCULAR | Status: DC | PRN
  Administered 2011-11-07 (×3): 25 ug via INTRAVENOUS

## 2011-11-07 MED ORDER — PANTOPRAZOLE SODIUM 40 MG IV SOLR
40.0000 mg | Freq: Every day | INTRAVENOUS | Status: DC
  Administered 2011-11-07 – 2011-11-09 (×3): 40 mg via INTRAVENOUS
  Filled 2011-11-07 (×4): qty 40

## 2011-11-07 MED ORDER — POTASSIUM CHLORIDE CRYS ER 20 MEQ PO TBCR: 40.0000 meq | EXTENDED_RELEASE_TABLET | Freq: Once | ORAL | Status: DC

## 2011-11-07 MED ORDER — LORAZEPAM 2 MG/ML IJ SOLN: 0.5000 mg | Freq: Three times a day (TID) | INTRAMUSCULAR | Status: DC | PRN

## 2011-11-07 MED ORDER — ONDANSETRON HCL 4 MG/2ML IJ SOLN: 4.0000 mg | Freq: Four times a day (QID) | INTRAMUSCULAR | Status: DC | PRN

## 2011-11-07 NOTE — ED Notes (Signed)
Pt returned from xray

## 2011-11-07 NOTE — Progress Notes (Signed)
Please cancel colonoscopy.

## 2011-11-07 NOTE — Progress Notes (Signed)
Patient ID: Kelly Baldwin, female   DOB: 12-17-1965, 46 y.o.   MRN: [id number]  Subjective: No events overnight. Patient denies chest pain, shortness of breath, abdominal pain.   Objective:  Vital signs in last 24 hours:  Filed Vitals:   11/07/11 0505 11/07/11 0615 11/07/11 1406 11/07/11 1528  BP: 120/87  104/71 137/77  Pulse: 97  93   Temp: 99.3 F (37.4 C)  97.3 F (36.3 C) 99 F (37.2 C)  TempSrc: Oral  Oral Oral  Resp: 20  18 10   Height:  5\' 6"  (1.676 m)  5\' 6"  (1.676 m)  Weight:  85.367 kg (188 lb 3.2 oz)  85.276 kg (188 lb)  SpO2: 100%  99% 99%   Intake/Output from previous day:  Intake/Output Summary (Last 24 hours) at 11/07/11 1648 Last data filed at 11/07/11 4098  Gross per 24 hour  Intake    100 ml  Output      0 ml  Net    100 ml    Physical Exam: General: Alert, awake, oriented x3, in no acute distress. HEENT: No bruits, no goiter. Moist mucous membranes, no scleral icterus, no conjunctival pallor. Heart: Regular rate and rhythm, S1/S2 +, no murmurs, rubs, gallops. Lungs: Clear to auscultation bilaterally. No wheezing, no rhonchi, no rales.  Abdomen: Soft, nontender, nondistended, positive bowel sounds. Extremities: No clubbing or cyanosis, no pitting edema,  positive pedal pulses. Neuro: Grossly nonfocal.  Lab Results:  Basic Metabolic Panel:    Component Value Date/Time   NA 134* 11/07/2011 0700   K 3.0* 11/07/2011 0700   CL 98 11/07/2011 0700   CO2 24 11/07/2011 0700   BUN 16 11/07/2011 0700   CREATININE 0.96 11/07/2011 0700   GLUCOSE 100* 11/07/2011 0700   CALCIUM 9.6 11/07/2011 0700   CBC:    Component Value Date/Time   WBC 11.2* 11/07/2011 0700   HGB 13.2 11/07/2011 0700   HCT 37.0 11/07/2011 0700   PLT 337 11/07/2011 0700   MCV 81.1 11/07/2011 0700   NEUTROABS 7.9* 11/06/2011 2142   LYMPHSABS 2.6 11/06/2011 2142   MONOABS 0.7 11/06/2011 2142   EOSABS 0.3 11/06/2011 2142   BASOSABS 0.1 11/06/2011 2142      Lab 11/07/11 0700 11/06/11 2142  WBC  11.2* 11.5*  HGB 13.2 15.0  HCT 37.0 41.2  PLT 337 359  MCV 81.1 81.3  MCH 28.9 29.6  MCHC 35.7 36.4*  RDW 14.1 14.1  LYMPHSABS -- 2.6  MONOABS -- 0.7  EOSABS -- 0.3  BASOSABS -- 0.1  BANDABS -- --    Lab 11/07/11 0700 11/06/11 2142  NA 134* 133*  K 3.0* 3.4*  CL 98 95*  CO2 24 26  GLUCOSE 100* 121*  BUN 16 20  CREATININE 0.96 1.14*  CALCIUM 9.6 10.7*  MG -- --   Recent Results (from the past 240 hour(s))  URINE CULTURE     Status: Normal   Collection Time   10/31/11 11:46 AM      Component Value Range Status Comment   Culture ESCHERICHIA COLI   Final    Colony Count >=100,000 COLONIES/ML   Final    Organism ID, Bacteria ESCHERICHIA COLI   Final   WET PREP, GENITAL     Status: Abnormal   Collection Time   11/06/11 11:12 PM      Component Value Range Status Comment   Yeast Wet Prep HPF POC NONE SEEN  NONE SEEN  Final    Trich, Wet Prep FEW (*)  NONE SEEN  Final    Clue Cells Wet Prep HPF POC FEW (*) NONE SEEN  Final    WBC, Wet Prep HPF POC FEW (*) NONE SEEN  Final     Studies/Results: Dg Chest 2 View 11-11-2011   IMPRESSION: No acute cardiopulmonary process seen.    Ct Angio Chest W/cm &/or Wo  11/07/2011  IMPRESSION:  1.  Poorly characterized 4.8 x 4.1 x 3.2 cm mass noted along the third and fourth segments of the duodenum, with vague additional soft tissue density extending proximally along the duodenum.  This results in partial obstruction of the duodenum, with significant distension of the first and second segments.  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 2.  Complex multilobulated mass superior to the vaginal cuff; altogether, this measures approximately 4.4 x 4.4 x 3.9 cm.  Small amount of associated fluid seen.  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  Infiltration of the underlying vaginal cuff cannot be excluded. 3.  1.6 cm omental  metastasis suspected along the midline below the umbilicus; no additional omental metastases seen. 4.  No significant retroperitoneal lymphadenopathy identified. 5.  2.1 cm stone lodged along the proximal aspect of the gallbladder; gallbladder otherwise unremarkable in appearance.  No evidence for obstruction or cholecystitis. 6.  Mild soft tissue inflammation in the prevesical space may be reactive secondary to the lesions described in the pelvis.  These results were called by telephone on 11/07/2011  at  02:08 a.m. to  Dr. Deanna Artis, who verbally acknowledged these results.   Medications: Scheduled Meds:   . sodium chloride   Intravenous Once  . cefTRIAXone (ROCEPHIN) IVPB 1 gram/50 mL D5W  1 g Intravenous Q24H  . iohexol  20 mL Oral Q1 Hr x 2  . metronidazole  500 mg Intravenous Q8H  .  morphine injection  6 mg Intravenous Once  . ondansetron (ZOFRAN) IV  4 mg Intravenous Once  . pantoprazole (PROTONIX) IV  40 mg Intravenous QHS  . potassium chloride  10 mEq Intravenous Q1 Hr x 4  . sodium chloride  1,000 mL Intravenous Once  . sodium chloride  3 mL Intravenous Q12H  . DISCONTD: potassium chloride  40 mEq Oral Once   Continuous Infusions:   . 0.9 % NaCl with KCl 20 mEq / L 100 mL/hr at 11/07/11 0634   PRN Meds:.acetaminophen, acetaminophen, albuterol, iohexol, LORazepam, morphine, ondansetron (ZOFRAN) IV, ondansetron, zolpidem  Assessment/Plan:  Principal Problem:  *Metastatic cancer - Gi consult obtained, will follow up on recommendations - GI oncology called ([phone], Quenton Fetter, GI oncology navigator who will be following with the pt once biopsy results received) as recommended by Dr. Welton Flakes  Active Problems:  Cholelithiasis - pt is asymptomatic   Hypokalemia - will continue to supplement   Partial small bowel obstruction - continue providing supportive care   Hyponatremia - resolving   Dehydration - continue IVF   UTI (lower urinary tract infection) -  continue ABX   Trichomonal vaginitis - new finding dicussed with pt   EDUCATION - test results and diagnostic studies were discussed with patient and pt's family who was present at the bedside (mother and cousin) - patient and family have verbalized the understanding - questions were answered at the bedside and contact information was provided for additional questions or concerns   LOS: 1 day   MAGICK-Naol Ontiveros 11/07/2011, 4:48 PM  [facility]  Pager: [phone]

## 2011-11-07 NOTE — Op Note (Addendum)
[facility] [address] [place], [place]  [address]  OPERATIVE PROCEDURE REPORT  PATIENT:  Kelly Baldwin, Kelly Baldwin  MR#:  [id number] BIRTHDATE:  [birth year], 45 yrs. old  GENDER:  female  ENDOSCOPIST:  Iva Boop, MD, Clementeen Graham ASSISTANT:  Kandice Robinsons and Darral Dash, RN  PROCEDURE DATE:  11/07/2011 PROCEDURE:  Small Bowel Endoscopy and Biopsy ASA CLASS:  Class II INDICATIONS:  duodenal mass on CT, nausea and vomiting  MEDICATIONS:   Fentanyl 75 mcg IV, Versed 6 mg IV TOPICAL ANESTHETIC:  Cetacaine Spray  DESCRIPTION OF PROCEDURE:   After the risks benefits and alternatives of the procedure were thoroughly explained, informed consent was obtained.  The [id number] ([id number] endoscope was introduced through the mouth and advanced to the third portion of the duodenum, without limitations.  The instrument was slowly withdrawn as the mucosa was fully examined. <<PROCEDUREIMAGES>>  A mass was found in the third portion of the duodenum. Circumferential mass seen with significant obstruction. 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 Some ulceration suspected in stenotic area. Friable. Multiple biopsies were obtained and sent to pathology.  Retained food was present in the body of the stomach. Small-moderate amount of thick fluid and pieces of suspected food - about 100cc total suctioned.  otherwise normal exam. The duodenum was dilated. Could not identify papilla but bile was draining and location of mass should be at least a few cm below the papilla.    Retroflexed views revealed no abnormalities. The scope was then withdrawn from the patient and the procedure terminated.  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 Biopsied. 2) Food, retained in the body of the stomach - removed 3) Otherwise normal exam RECOMMENDATIONS: 1) await biopsy  results 2) Will need an oncology consult 3) Surgical consult likely also though looks like widely metastatic disease so intervention will depend upon origin of tumor and treatment plans. Palliative decompression will be needed, stenting would be difficult and given her functional status and possibility of chemo might not be best option. Again, pathology and ocverall treatment plans and goals need to be defined. 4) Will allow sips of clears  Iva Boop, MD, Clementeen Graham  n. eSIGNED:   Iva Boop at 11/07/2011 05:37 PM  Dorathy Daft, [phone]      Results discussed with mother. Iva Boop, MD, Clementeen Graham

## 2011-11-07 NOTE — ED Notes (Signed)
Paged [facility] to [phone] 

## 2011-11-07 NOTE — H&P (Signed)
PCP:   Lorretta Harp, MD, MD    Chief Complaint:   RLQ pain  HPI: Kelly Baldwin is a 46 y.o. female   has a past medical history of GERD (gastroesophageal reflux disease); Onychomycosis of toenail; Leg pain; History of recurrent UTIs; and hysterectomy for benign disease.   Presented with  RLQ pain and 3-4 weeks of Nausea and vomiting. Initialy thought her gall bladder was involved. US done on 26 showed cholelithiasis but no cholecystitis. 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 Patient is being admitted for further evaluation and management. Of note she has had frequent UTI and have recently finished SEPTRA. Her wet prep was mildly positive for Trichomonas  Review of Systems:    Pertinent positives include:weight loss, nausea, vomiting, abdominal pain  Constitutional:  No , night sweats, Fevers, chills, fatigue.  HEENT:  No headaches, Difficulty swallowing,Tooth/dental problems,Sore throat,  No sneezing, itching, ear ache, nasal congestion, post nasal drip,  Cardio-vascular:  No chest pain, Orthopnea, PND, anasarca, dizziness, palpitations.no Bilateral lower extremity swelling  GI:  No heartburn, indigestion, abdominal pain, diarrhea, change in bowel habits, loss of appetite, melena, blood in stool, hematoemesis Resp:  no shortness of breath at rest. No dyspnea on exertion, No excess mucus, no productive cough, No non-productive cough, No coughing up of blood.No change in color of mucus.No wheezing.No chest wall deformity  Skin:  no rash or lesions.  GU:  no dysuria, change in color of urine, no urgency or frequency. No flank pain.  Musculoskeletal:  No joint pain or swelling. No decreased range of motion. No back pain.  Psych:  No change in mood or affect. No depression or anxiety. No memory loss.   Neuro: no localizing neurological complaints, no tingling, no weakness, no double vision, no gait abnormality, no slurred speech   Otherwise ROS are negative except for above, 10 systems were reviewed  Past Medical History: Past Medical History  Diagnosis Date  . GERD (gastroesophageal reflux disease)   . Onychomycosis of toenail   . Leg pain   . History of recurrent UTIs   . Hx of hysterectomy for benign disease     partial   Past Surgical History  Procedure Date  . Tubal ligation   . Abdominal hysterectomy 2009    bleeding non cancer partial fibrods     Medications: Prior to Admission medications   Medication Sig Start Date End Date Taking? Authorizing Provider  potassium chloride SA (K-DUR,KLOR-CON) 20 MEQ tablet Take 20 mEq by mouth daily.   Yes Historical Provider, MD  promethazine (PHENERGAN) 25 MG tablet Take 25 mg by mouth every 6 (six) hours as needed. For nausea   Yes Historical Provider, MD  triamterene-hydrochlorothiazide (DYAZIDE) 37.5-25 MG per capsule Take 1 capsule by mouth daily.   Yes Historical Provider, MD  sulfamethoxazole-trimethoprim (BACTRIM DS,SEPTRA DS) 800-160 MG per tablet Take 1 tablet by mouth 2 (two) times daily.    Historical Provider, MD    Allergies:  No Known Allergies  Social History:  Ambulatory independently Lives at home with family    reports that she has quit smoking. She has never used smokeless tobacco. She reports that she does not drink alcohol or use illicit drugs.   Family History: family history includes Colon cancer in her father and paternal grandfather; Coronary artery disease in  her father; Diabetes in her father and mother; Glaucoma in her mother; Hyperlipidemia in her father; and Hypertension in her father and mother.    Physical Exam: Patient Vitals for the past 24 hrs:  BP Temp Temp src Pulse Resp SpO2  11/07/11 0505 120/87 mmHg 99.3 F (37.4 C) Oral 97  20  100 %  11/07/11 0433 - 98.9 F (37.2 C) Oral - - -    11/07/11 0430 127/77 mmHg - - 104  15  100 %  11/07/11 0400 163/94 mmHg - - 113  17  100 %  11/07/11 0330 122/81 mmHg - - 104  16  100 %  11/07/11 0300 128/89 mmHg - - 115  13  100 %  11/07/11 0245 - 98.3 F (36.8 C) Oral - - -  11/07/11 0230 122/83 mmHg - - 91  17  100 %  11/07/11 0215 - - - 89  21  100 %  11/07/11 0200 112/78 mmHg - - 97  15  99 %  11/07/11 0145 - - - 92  17  99 %  11/07/11 0130 114/75 mmHg - - 96  18  100 %  11/07/11 0115 - - - 93  16  100 %  11/07/11 0045 - - - 93  10  100 %  11/07/11 0030 119/85 mmHg - - 88  15  100 %  11/07/11 0015 - - - 95  16  100 %  11/07/11 0000 - - - 82  12  99 %  11/06/11 2345 - - - 93  14  99 %  11/06/11 2330 121/81 mmHg - - 98  14  100 %  11/06/11 2300 131/82 mmHg - - - 16  -  11/06/11 2224 127/83 mmHg - - 99  9  100 %  11/06/11 1838 131/89 mmHg 98.9 F (37.2 C) Oral 120  16  100 %    1. General:  Tearful, actively vomiting 2. Psychological: Alert and Oriented 3. Head/ENT:   Moist  Mucous Membranes                          Head Non traumatic, neck supple                          Normal  Dentition 4. SKIN:  decreased Skin turgor,  Skin clean Dry and intact no rash 5. Heart: Regular rate and rhythm no Murmur, Rub or gallop 6. Lungs: Clear to auscultation bilaterally, no wheezes or crackles   7. Abdomen: Soft, mild suprapubic tenderness, Non distended 8. Lower extremities: no clubbing, cyanosis, or edema 9. Neurologically Grossly intact, moving all 4 extremities equally 10. MSK: Normal range of motion  body mass index is unknown because there is no height or weight on file.   Labs on Admission:   [facility] 11/06/11 2142  NA 133*  K 3.4*  CL 95*  CO2 26  GLUCOSE 121*  BUN 20  CREATININE 1.14*  CALCIUM 10.7*  MG --  PHOS --    Basename 11/06/11 2142  AST 17  ALT 18  ALKPHOS 112  BILITOT 0.8  PROT 8.5*  ALBUMIN 4.1   No results found for this basename: LIPASE:2,AMYLASE:2 in the last 72 hours  Basename 11/06/11  2142  WBC 11.5*  NEUTROABS 7.9*  HGB 15.0  HCT 41.2  MCV 81.3  PLT 359   No results found for this basename: CKTOTAL:3,CKMB:3,CKMBINDEX:3,TROPONINI:3 in  the last 72 hours No results found for this basename: TSH,T4TOTAL,FREET3,T3FREE,THYROIDAB in the last 72 hours No results found for this basename: VITAMINB12:2,FOLATE:2,FERRITIN:2,TIBC:2,IRON:2,RETICCTPCT:2 in the last 72 hours No results found for this basename: HGBA1C    The CrCl is unknown because both a height and weight (above a minimum accepted value) are required for this calculation. ABG No results found for this basename: phart, pco2, po2, hco3, tco2, acidbasedef, o2sat     Lab Results  Component Value Date   DDIMER 0.69* 11/06/2011     Other results:   UA mild UTI   Cultures: No results found for this basename: sdes, specrequest, cult, reptstatus       Radiological Exams on Admission: Dg Chest 2 View  11/06/2011  *RADIOLOGY REPORT*  Clinical Data: Right-sided chest pain, radiating down the right arm.  Shortness of breath.  Abdominal pain and vomiting for 3 weeks.  History of smoking.  CHEST - 2 VIEW  Comparison: None.  Findings: The lungs are well-aerated and clear.  There is no evidence of focal opacification, pleural effusion or pneumothorax.  The heart is normal in size; the mediastinal contour is within normal limits.  No acute osseous abnormalities are seen.  The visualized bowel gas pattern is grossly unremarkable; air and fluid are noted within the stomach.  IMPRESSION: No acute cardiopulmonary process seen.  Original Report Authenticated By: Tonia Ghent, M.D.   Ct Angio Chest W/cm &/or Wo Cm  11/07/2011  *RADIOLOGY REPORT*  Clinical Data:  Mid to upper and lower right-sided abdominal pain; nausea and vomiting.  Fever.  CT ANGIOGRAPHY CHEST CT ABDOMEN AND PELVIS WITH CONTRAST  Technique:  Multidetector CT imaging of the chest was performed using the standard protocol during bolus administration of intravenous  contrast.  Multiplanar CT image reconstructions including MIPs were obtained to evaluate the vascular anatomy. Multidetector CT imaging of the abdomen and pelvis was performed using the standard protocol during bolus administration of intravenous contrast.  Contrast: 100 mL of Omnipaque 300 IV contrast  Comparison:  Abdominal ultrasound performed 11/05/2011  CTA CHEST  Findings:  There is no evidence of pulmonary embolus.  Tiny nodular densities along the right minor fissure are thought to reflect lymph nodes.  There are two small 3 mm nodules noted within the right middle lobe (images 43 and 46 of 91).  There is also a 4 mm nodule within the left lower lobe (image 52 of 91).  Though these may be post infectious or post inflammatory in nature, given the findings in the abdomen and pelvis, early metastasis cannot be entirely excluded.  There is no evidence of significant focal consolidation, pleural effusion or pneumothorax.  No abnormal focal contrast enhancement is seen.  The mediastinum is unremarkable in appearance.  Residual thymic tissue is within normal limits.  No pericardial effusion is identified.  No mediastinal lymphadenopathy is seen.  The great vessels are unremarkable in appearance.  No axillary lymphadenopathy is seen.  The visualized portions of the thyroid gland are unremarkable in appearance.  No acute osseous abnormalities are seen.   Review of the MIP images confirms the above findings.  IMPRESSION:  1.  No evidence of pulmonary embolus. 2.  A few small bilateral pulmonary nodules are seen, measuring up to 4 mm in size.  Though these may be post infectious or post inflammatory in nature, given the findings in the abdomen and pelvis, early metastatic disease cannot be entirely excluded.  CT ABDOMEN AND PELVIS  Findings: The liver and spleen are  unremarkable in appearance. There is a 2.1 cm stone noted lodged along the proximal aspect of the gallbladder.  The gallbladder is otherwise unremarkable  in appearance; there is no evidence for obstruction or cholecystitis. The pancreas and adrenal glands are unremarkable.  There is a poorly characterized mass identified along the third and fourth segments of the duodenum, measuring approximately 4.8 x 4.1 x 3.2 cm, with vague additional soft tissue density extending more proximally along the duodenum.  This results in partial obstruction of the duodenum, with significant distension of the first and second segment of the duodenum.  The mass is better characterized on coronal and sagittal images.  No definite retroperitoneal lymphadenopathy is seen; no suspicious peripancreatic or periportal nodes are identified.  The kidneys are unremarkable in appearance.  There is no evidence of hydronephrosis.  No renal or ureteral stones are seen.  No perinephric stranding is appreciated.  The stomach is filled with contrast and is grossly unremarkable in appearance.  No acute vascular abnormalities are seen.  There is a tiny 4 mm node adjacent to the distal abdominal aorta, nonspecific in appearance.  Within the pelvis, note is made of a complex multilobulated mass superior to the vaginal cuff.  This difficult to distinguish fully from the adjacent vaginal cuff, but measures approximately 4.4 x 4.4 x 3.9 cm.  A small amount of associated fluid is seen.  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  Given the irregular heterogeneous appearance of the vaginal cuff, infiltration into the vaginal cuff cannot entirely excluded.  The appendix is normal in caliber, without evidence for appendicitis.  The colon is grossly unremarkable in appearance, though the suspected drop metastases within the pelvis abut the distal sigmoid colon, and may be partially adherent to it.  There appears to be a 1.6 cm omental metastasis noted along the midline below the umbilicus.  No additional  omental metastases are seen.  The bladder is mildly distended and grossly unremarkable in appearance.  Mild soft tissue inflammation is noted in the prevesical space, possibly reactive secondary to the lesions described above. The ovaries are relatively superior in position and appear grossly unremarkable; no suspicious adnexal masses are identified.  No inguinal lymphadenopathy is seen.  No acute osseous abnormalities are identified.  Review of the MIP images confirms the above findings.  IMPRESSION:  1.  Poorly characterized 4.8 x 4.1 x 3.2 cm mass noted along the third and fourth segments of the duodenum, with vague additional soft tissue density extending proximally along the duodenum.  This results in partial obstruction of the duodenum, with significant distension of the first and second segments.  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 2.  Complex multilobulated mass superior to the vaginal cuff; altogether, this measures approximately 4.4 x 4.4 x 3.9 cm.  Small amount of associated fluid seen.  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  Infiltration of the underlying vaginal cuff cannot be excluded. 3.  1.6 cm omental metastasis suspected along the midline below the umbilicus; no additional omental metastases seen. 4.  No significant retroperitoneal lymphadenopathy identified. 5.  2.1 cm stone lodged along the proximal aspect of the gallbladder; gallbladder otherwise unremarkable in appearance.  No evidence for obstruction or cholecystitis. 6.  Mild soft tissue inflammation in the prevesical space may be reactive  secondary to the lesions described in the pelvis.  These results were called by telephone on 11/07/2011  at  02:08 a.m. to  Dr. Deanna Artis, who verbally acknowledged these results.  Original Report Authenticated By: Tonia Ghent, M.D.   US Abdomen  Complete  11/05/2011  *RADIOLOGY REPORT*  Clinical Data:  Abdominal pain and vomiting  ABDOMINAL ULTRASOUND COMPLETE  Comparison:  None.  Findings:  Gallbladder: There is a 2.4 cm mobile gallstone within the gallbladder lumen.  No gallbladder wall thickening or pericholecystic fluid. Negative sonographic Murphy's sign.  Common Bile Duct:  Within normal limits in caliber. Measures 3.8 mm.  Liver: No focal mass lesion identified. There is mild, diffuse fatty infiltration.  IVC:  Appears normal.  Pancreas:  No abnormality identified.  Spleen:  Within normal limits in size and echotexture.  Right kidney:  Normal in size and parenchymal echogenicity.  No evidence of mass or hydronephrosis.  Left kidney:  Normal in size and parenchymal echogenicity.  No evidence of mass or hydronephrosis.  Abdominal Aorta:  No aneurysm identified.  IMPRESSION: Cholelithiasis without sonographic evidence of cholecystitis.  Original Report Authenticated By: Brandon Melnick, M.D.   Ct Abdomen Pelvis W Contrast  11/07/2011  *RADIOLOGY REPORT*  Clinical Data:  Mid to upper and lower right-sided abdominal pain; nausea and vomiting.  Fever.  CT ANGIOGRAPHY CHEST CT ABDOMEN AND PELVIS WITH CONTRAST  Technique:  Multidetector CT imaging of the chest was performed using the standard protocol during bolus administration of intravenous contrast.  Multiplanar CT image reconstructions including MIPs were obtained to evaluate the vascular anatomy. Multidetector CT imaging of the abdomen and pelvis was performed using the standard protocol during bolus administration of intravenous contrast.  Contrast: 100 mL of Omnipaque 300 IV contrast  Comparison:  Abdominal ultrasound performed 11/05/2011  CTA CHEST  Findings:  There is no evidence of pulmonary embolus.  Tiny nodular densities along the right minor fissure are thought to reflect lymph nodes.  There are two small 3 mm nodules noted within the right middle lobe (images 43 and 46 of 91).  There is  also a 4 mm nodule within the left lower lobe (image 52 of 91).  Though these may be post infectious or post inflammatory in nature, given the findings in the abdomen and pelvis, early metastasis cannot be entirely excluded.  There is no evidence of significant focal consolidation, pleural effusion or pneumothorax.  No abnormal focal contrast enhancement is seen.  The mediastinum is unremarkable in appearance.  Residual thymic tissue is within normal limits.  No pericardial effusion is identified.  No mediastinal lymphadenopathy is seen.  The great vessels are unremarkable in appearance.  No axillary lymphadenopathy is seen.  The visualized portions of the thyroid gland are unremarkable in appearance.  No acute osseous abnormalities are seen.   Review of the MIP images confirms the above findings.  IMPRESSION:  1.  No evidence of pulmonary embolus. 2.  A few small bilateral pulmonary nodules are seen, measuring up to 4 mm in size.  Though these may be post infectious or post inflammatory in nature, given the findings in the abdomen and pelvis, early metastatic disease cannot be entirely excluded.  CT ABDOMEN AND PELVIS  Findings: The liver and spleen are unremarkable in appearance. There is a 2.1 cm stone noted lodged along the proximal aspect of the gallbladder.  The gallbladder is otherwise unremarkable in appearance; there is no evidence for obstruction or cholecystitis. The pancreas and adrenal glands are unremarkable.  There is a poorly characterized mass identified along the third and fourth segments of the duodenum, measuring approximately 4.8 x 4.1 x 3.2 cm, with vague additional soft tissue density extending more proximally along the duodenum.  This results in partial obstruction of the duodenum, with significant distension of the first and second segment of the duodenum.  The mass is better characterized on coronal and sagittal images.  No definite retroperitoneal lymphadenopathy is seen; no suspicious  peripancreatic or periportal nodes are identified.  The kidneys are unremarkable in appearance.  There is no evidence of hydronephrosis.  No renal or ureteral stones are seen.  No perinephric stranding is appreciated.  The stomach is filled with contrast and is grossly unremarkable in appearance.  No acute vascular abnormalities are seen.  There is a tiny 4 mm node adjacent to the distal abdominal aorta, nonspecific in appearance.  Within the pelvis, note is made of a complex multilobulated mass superior to the vaginal cuff.  This difficult to distinguish fully from the adjacent vaginal cuff, but measures approximately 4.4 x 4.4 x 3.9 cm.  A small amount of associated fluid is seen.  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  Given the irregular heterogeneous appearance of the vaginal cuff, infiltration into the vaginal cuff cannot entirely excluded.  The appendix is normal in caliber, without evidence for appendicitis.  The colon is grossly unremarkable in appearance, though the suspected drop metastases within the pelvis abut the distal sigmoid colon, and may be partially adherent to it.  There appears to be a 1.6 cm omental metastasis noted along the midline below the umbilicus.  No additional omental metastases are seen.  The bladder is mildly distended and grossly unremarkable in appearance.  Mild soft tissue inflammation is noted in the prevesical space, possibly reactive secondary to the lesions described above. The ovaries are relatively superior in position and appear grossly unremarkable; no suspicious adnexal masses are identified.  No inguinal lymphadenopathy is seen.  No acute osseous abnormalities are identified.  Review of the MIP images confirms the above findings.  IMPRESSION:  1.  Poorly characterized 4.8 x 4.1 x 3.2 cm mass noted along the third and fourth segments of the duodenum,  with vague additional soft tissue density extending proximally along the duodenum.  This results in partial obstruction of the duodenum, with significant distension of the first and second segments.  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 2.  Complex multilobulated mass superior to the vaginal cuff; altogether, this measures approximately 4.4 x 4.4 x 3.9 cm.  Small amount of associated fluid seen.  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  Infiltration of the underlying vaginal cuff cannot be excluded. 3.  1.6 cm omental metastasis suspected along the midline below the umbilicus; no additional omental metastases seen. 4.  No significant retroperitoneal lymphadenopathy identified. 5.  2.1 cm stone lodged along the proximal aspect of the gallbladder; gallbladder otherwise unremarkable in appearance.  No evidence for obstruction or cholecystitis. 6.  Mild soft tissue inflammation in the prevesical space may be reactive secondary to the lesions described in the pelvis.  These results were called by telephone on 11/07/2011  at  02:08 a.m. to  Dr. Deanna Artis, who verbally acknowledged these results.  Original Report Authenticated By: Tonia Ghent, M.D.  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 Patient for now refused NGT .Hyponatremia - secondary to dehydration .Dehydration - give IVF .UTI (lower urinary tract infection) - rocephin and urin  culture .Trichomonal vaginitis - given patient's current delicate situation have not notified her about this results yet but will need to let her know in the morning. For now will initiate metronidazole, will have to give IV due to partial obstruction   Prophylaxis: SCD Protonix     Kelly Baldwin 11/07/2011, 3:27 AM

## 2011-11-07 NOTE — Consult Note (Addendum)
[facility] Consultation  Requesting Provider: [facility] hospitalist Primary Care Physician:  Lorretta Harp, MD, MD Primary Gastroenterologist:  Dr. 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 Omental lesions consistent with metastases and a mass above the vaginal cuff.      Overall picture consistent with metastatic cancer. P possibly a primary duodenal lesion but we don't know that at this point. This is his very advanced.      She is unable to adequately eat and maintain nutrition at this time.     Recommendations:  Upper endoscopy or enteroscopy today, for tissue diagnosis of the duodenal lesion.     Subsequently, the patient will need oncology input, and probably surgery input.     We'll need to address nutrition, that will depend upon what is decided as far as treatment of his duodenal lesion, whether it be surgical or if an enteral stent as possible. Again all of this will depend upon the overall treatment      approach. That is going to take a little bit of time, at least to sort out and I have explained this to her. Low threshold to start TPN. Does not need today, but may in the next couple of days.  The risks and benefits as well as alternatives of endoscopic procedure(s) have been discussed and reviewed. All questions answered. The patient agrees to proceed.    HPI: Kelly Baldwin is a 46 y.o. female with a 3 to 4 week history of constipation and nausea and vomiting. There is associated 10+ pound weight loss. She would eat and within an hour or so she would vomit the food or liquids she saw primary care, she was referred to gastroenterology. She was having right lower quadrant pain. She saw my partner, Dr.  Arlyce Dice yesterday. An ultrasound showed gallstones. No other abnormalities. Because of her symptoms, he recommended half of a coloscopy prepped to treat constipation and a subsequent colonoscopy to investigate for cause of her symptoms. She could not tolerate the colonoscopy prep, causing increasing vomiting and abdominal pain and she presented to the emergency department. A CT scan demonstrated a duodenal mass, in the third or fourth portion of the duodenum causing partial obstruction at least, mental changes consistent with metastases and a mass of for vaginal cuff. She was admitted, and has done well without nausea or vomiting since having anti-emetics, she's had some pain medication as well though none since yesterday. She is hungry but comfortable at this time. She is aware that she most likely has a metastatic cancer   Past Medical History  Diagnosis Date  . GERD (gastroesophageal reflux disease)   . Onychomycosis of toenail   . Leg pain   . History of recurrent UTIs   . Hx of hysterectomy for benign disease     partial    Past Surgical History  Procedure Date  . Tubal ligation   . Abdominal hysterectomy 2009    bleeding non cancer partial fibrods  . Ovarian cyst surgery     Prior to Admission medications   Medication Sig Start Date End Date Taking? Authorizing Provider  potassium chloride SA (K-DUR,KLOR-CON) 20 MEQ tablet Take 20 mEq by mouth daily.   Yes Historical Provider, MD  promethazine (PHENERGAN) 25 MG tablet Take  25 mg by mouth every 6 (six) hours as needed. For nausea   Yes Historical Provider, MD  triamterene-hydrochlorothiazide (DYAZIDE) 37.5-25 MG per capsule Take 1 capsule by mouth daily.   Yes Historical Provider, MD  sulfamethoxazole-trimethoprim (BACTRIM DS,SEPTRA DS) 800-160 MG per tablet Take 1 tablet by mouth 2 (two) times daily.    Historical Provider, MD    Current Facility-Administered Medications  Medication Dose Route Frequency Provider Last Rate Last Dose   . 0.9 % NaCl with KCl 20 mEq/ L  infusion   Intravenous Continuous Therisa Doyne, MD 100 mL/hr at 11/07/11 2440    . acetaminophen (TYLENOL) tablet 650 mg  650 mg Oral Q6H PRN Therisa Doyne, MD       Or  . acetaminophen (TYLENOL) suppository 650 mg  650 mg Rectal Q6H PRN Therisa Doyne, MD      . albuterol (PROVENTIL) (5 MG/ML) 0.5% nebulizer solution 2.5 mg  2.5 mg Nebulization Q2H PRN Therisa Doyne, MD      . cefTRIAXone (ROCEPHIN) 1 g in dextrose 5 % 50 mL IVPB  1 g Intravenous Q24H Therisa Doyne, MD   1 g at 11/07/11 1027  . iohexol (OMNIPAQUE) 300 MG/ML solution 100 mL  100 mL Intravenous Once PRN Medication Radiologist, MD   100 mL at 11/07/11 0128  . iohexol (OMNIPAQUE) 300 MG/ML solution 20 mL  20 mL Oral Q1 Hr x 2 Medication Radiologist, MD   20 mL at 11/06/11 2216  . LORazepam (ATIVAN) injection 0.5 mg  0.5 mg Intravenous Q8H PRN Therisa Doyne, MD      . metroNIDAZOLE (FLAGYL) IVPB 500 mg  500 mg Intravenous Q8H Therisa Doyne, MD   500 mg at 11/07/11 2536  . morphine 2 MG/ML injection 2 mg  2 mg Intravenous Q4H PRN Therisa Doyne, MD      . morphine 4 MG/ML injection 6 mg  6 mg Intravenous Once Sheran Luz, MD   6 mg at 11/06/11 2245  . ondansetron (ZOFRAN) injection 4 mg  4 mg Intravenous Once Sheran Luz, MD   4 mg at 11/06/11 2245  . ondansetron (ZOFRAN) tablet 4 mg  4 mg Oral Q6H PRN Therisa Doyne, MD       Or  . ondansetron (ZOFRAN) injection 4 mg  4 mg Intravenous Q6H PRN Therisa Doyne, MD      . pantoprazole (PROTONIX) injection 40 mg  40 mg Intravenous QHS Anastassia Doutova, MD      . potassium chloride 10 mEq in 100 mL IVPB  10 mEq Intravenous Q1 Hr x 4 Manson Passey, MD   10 mEq at 11/07/11 1024  . sodium chloride 0.9 % bolus 1,000 mL  1,000 mL Intravenous Once Sheran Luz, MD   1,000 mL at 11/06/11 2231  . sodium chloride 0.9 % injection 3 mL  3 mL Intravenous Q12H Therisa Doyne, MD      . zolpidem (AMBIEN) tablet 10 mg  10  mg Oral QHS PRN Therisa Doyne, MD      . DISCONTD: potassium chloride SA (K-DUR,KLOR-CON) CR tablet 40 mEq  40 mEq Oral Once Manson Passey, MD        Allergies as of 11/06/2011  . (No Known Allergies)    Family History  Problem Relation Age of Onset  . Diabetes Mother     28  . Hypertension Mother   . Glaucoma Mother   . Diabetes Father     40  . Hyperlipidemia Father   . Hypertension Father   .  Coronary artery disease Father   . Colon cancer Father   . Colon cancer Paternal Grandfather     GF paternal father has polyps    History   Social History  . Marital Status: Married    Spouse Name: N/A    Number of Children: 2  . Years of Education: N/A         Social History Main Topics  . Smoking status: Former Smoker -- 1 years  . Smokeless tobacco: Never Used  . Alcohol Use: No     occasional  . Drug Use: No  . 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  No firearms. No excess sun exposure. Sees dentist regularly . No depressionNo tobacco    Review of Systems: Positive for subjective fever and chills. All other review of systems negative except as mentioned in the HPI.  Physical Exam: Vital signs in last 24 hours: Temp:  [98.3 F (36.8 C)-99.3 F (37.4 C)] 99.3 F (37.4 C) (02/28 0505) Pulse Rate:  [62-120] 97  (02/28 0505) Resp:  [9-21] 20  (02/28 0505) BP: (110-163)/(70-94) 120/87 mmHg (02/28 0505) SpO2:  [99 %-100 %] 100 % (02/28 0505) Weight:  [188 lb 3.2 oz (85.367 kg)-191 lb 6.4 oz (86.818 kg)] 188 lb 3.2 oz (85.367 kg) (02/28 0615) Last BM Date:  (Unsure of date, pt states "3 wks since BM") General:   Alert,  Well-developed, well-nourished, pleasant and cooperative in NAD Eyes:  Sclera clear, no icterus.   Conjunctiva pink. Mouth:  No  deformity or lesions.  Oropharynx pink & moist. Neck:  Supple; no masses or thyromegaly. Lungs:  Clear throughout to auscultation.   Heart:  Regular rate and rhythm; no murmurs, clicks, rubs,  or gallops. Abdomen:  Soft, nontender and nondistended. No masses, hepatosplenomegaly or hernias noted. Normal bowel sounds, without guarding, and without rebound.   Extremities:  Without clubbing or edema. Neurologic:  Alert and  oriented x 3 Skin:  Intact without significant lesions or rashes. She does have a tattoo in the right upper back Lymph Nodes:  No significant cervical or supraclavicular adenopathy. No inguinal or axillary adenopathy Psych:  Alert and cooperative. Normal mood and affect.    Lab Results:  Basename 11/07/11 0700 11/06/11 2142  WBC 11.2* 11.5*  HGB 13.2 15.0  HCT 37.0 41.2  PLT 337 359   BMET  Basename 11/07/11 0700 11/06/11 2142  NA 134* 133*  K 3.0* 3.4*  CL 98 95*  CO2 24 26  GLUCOSE 100* 121*  BUN 16 20  CREATININE 0.96 1.14*  CALCIUM 9.6 10.7*   LFT  Basename 11/07/11 0700  PROT 7.8  ALBUMIN 3.7  AST 16  ALT 16  ALKPHOS 104  BILITOT 1.1  BILIDIR --  IBILI --    Studies/Results: CT of chest, abdomen and pelvis with contrast, to 20 03/28/2012 - I have personally reviewed images 1. Poorly characterized 4.8 x 4.1 x 3.2 cm mass noted along the  third and fourth segments of the duodenum, with vague additional  soft tissue density extending proximally along the duodenum. This  results in partial obstruction of the duodenum, with significant  distension of the first and second segments. 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  2. Complex multilobulated  mass superior to the vaginal cuff;  altogether, this measures approximately 4.4 x 4.4 x 3.9 cm. Small  amount of associated fluid seen. 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 Infiltration  of the underlying vaginal cuff cannot be excluded.  3. 1.6 cm omental metastasis suspected along the midline below the  umbilicus; no additional omental metastases seen.  4. No significant retroperitoneal lymphadenopathy identified.  5. 2.1 cm stone lodged along the proximal aspect of the  gallbladder; gallbladder otherwise unremarkable in appearance. No  evidence for obstruction or cholecystitis.  6. Mild soft tissue inflammation in the prevesical space may be  reactive secondary to the lesions described in the pelvis.   Previous Endoscopies: None     LOS: 1 day      @Kiarrah Rausch  Sena Slate, MD, [facility] [phone] (pager) 11/07/2011 12:32 PM@

## 2011-11-07 NOTE — Progress Notes (Signed)
Utilization review completed.  

## 2011-11-07 NOTE — ED Notes (Signed)
Pt to CT on stretcher with transporter 

## 2011-11-07 NOTE — ED Notes (Signed)
MD at bedside. 

## 2011-11-08 ENCOUNTER — Encounter (HOSPITAL_COMMUNITY): Payer: Self-pay | Admitting: Internal Medicine

## 2011-11-08 DIAGNOSIS — C7982 Secondary malignant neoplasm of genital organs: Secondary | ICD-10-CM

## 2011-11-08 DIAGNOSIS — C170 Malignant neoplasm of duodenum: Secondary | ICD-10-CM

## 2011-11-08 DIAGNOSIS — C786 Secondary malignant neoplasm of retroperitoneum and peritoneum: Secondary | ICD-10-CM

## 2011-11-08 DIAGNOSIS — R109 Unspecified abdominal pain: Secondary | ICD-10-CM

## 2011-11-08 DIAGNOSIS — K56609 Unspecified intestinal obstruction, unspecified as to partial versus complete obstruction: Secondary | ICD-10-CM

## 2011-11-08 LAB — BASIC METABOLIC PANEL
CO2: 25 mEq/L (ref 19–32)
Calcium: 9.3 mg/dL (ref 8.4–10.5)
Chloride: 103 mEq/L (ref 96–112)
Glucose, Bld: 93 mg/dL (ref 70–99)
Sodium: 135 mEq/L (ref 135–145)

## 2011-11-08 LAB — URINE CULTURE: Culture  Setup Time: 201302280515

## 2011-11-08 LAB — PHOSPHORUS: Phosphorus: 2.8 mg/dL (ref 2.3–4.6)

## 2011-11-08 LAB — CBC
HCT: 33.4 % — ABNORMAL LOW (ref 36.0–46.0)
Hemoglobin: 11.6 g/dL — ABNORMAL LOW (ref 12.0–15.0)
MCH: 28.5 pg (ref 26.0–34.0)
MCV: 82.1 fL (ref 78.0–100.0)
Platelets: 306 10*3/uL (ref 150–400)
RBC: 4.07 MIL/uL (ref 3.87–5.11)
WBC: 14 10*3/uL — ABNORMAL HIGH (ref 4.0–10.5)

## 2011-11-08 LAB — MAGNESIUM: Magnesium: 2.1 mg/dL (ref 1.5–2.5)

## 2011-11-08 MED ORDER — BOOST / RESOURCE BREEZE PO LIQD
1.0000 | Freq: Two times a day (BID) | ORAL | Status: DC
  Administered 2011-11-09 – 2011-11-13 (×5): 1 via ORAL
  Filled 2011-11-08: qty 1

## 2011-11-08 NOTE — Progress Notes (Signed)
Subjective: Denying pain, nausea   Objective: Vital signs in last 24 hours: Filed Vitals:   11/07/11 1820 11/07/11 2140 11/08/11 0120 11/08/11 0441  BP: 119/75 97/64 107/74 102/72  Pulse:  97 96 88  Temp:  98.9 F (37.2 C) 99 F (37.2 C) 98.6 F (37 C)  TempSrc:  Oral Oral Oral  Resp: 38 19 16 16   Height:      Weight:      SpO2: 100% 98% 97% 98%    Intake/Output Summary (Last 24 hours) at 11/08/11 1530 Last data filed at 11/08/11 0500  Gross per 24 hour  Intake      0 ml  Output    300 ml  Net   -300 ml    Weight change: -0.091 kg (-3.2 oz)   General well-developed and well-nourished  HEENT: Normocephalic, atraumatic, PERRLA. Oral cavity without thrush or lesions.  Neck supple. no thyromegaly, no cervical or supraclavicular adenopathy  Lungs slightly decreased breath sounds on the R base . No wheezing, rhonchi or rales. No axillary masses.  Breasts: not examined.  Cardiac regular rate and rhythm normal S1-S2, no murmur , rubs or gallops  Abdomen soft nontender , bowel sounds x4. No HSM  GU/rectal: deferred.  Extremities no clubbing cyanosis or edema. No bruising or petechial rash  Lab Results: Results for orders placed during the hospital encounter of 11/06/11 (from the past 24 hour(s))  MAGNESIUM     Status: Normal   Collection Time   11/08/11  6:45 AM      Component Value Range   Magnesium 2.1  1.5 - 2.5 (mg/dL)  PHOSPHORUS     Status: Normal   Collection Time   11/08/11  6:45 AM      Component Value Range   Phosphorus 2.8  2.3 - 4.6 (mg/dL)  BASIC METABOLIC PANEL     Status: Abnormal   Collection Time   11/08/11  6:45 AM      Component Value Range   Sodium 135  135 - 145 (mEq/L)   Potassium 4.1  3.5 - 5.1 (mEq/L)   Chloride 103  96 - 112 (mEq/L)   CO2 25  19 - 32 (mEq/L)   Glucose, Bld 93  70 - 99 (mg/dL)   BUN 11  6 - 23 (mg/dL)   Creatinine, Ser 1.02  0.50 - 1.10 (mg/dL)   Calcium 9.3  8.4 - 72.5 (mg/dL)   GFR calc non Af Amer 76 (*) >90 (mL/min)   GFR  calc Af Amer 88 (*) >90 (mL/min)  CBC     Status: Abnormal   Collection Time   11/08/11  6:45 AM      Component Value Range   WBC 14.0 (*) 4.0 - 10.5 (K/uL)   RBC 4.07  3.87 - 5.11 (MIL/uL)   Hemoglobin 11.6 (*) 12.0 - 15.0 (g/dL)   HCT 36.6 (*) 44.0 - 46.0 (%)   MCV 82.1  78.0 - 100.0 (fL)   MCH 28.5  26.0 - 34.0 (pg)   MCHC 34.7  30.0 - 36.0 (g/dL)   RDW 34.7  42.5 - 95.6 (%)   Platelets 306  150 - 400 (K/uL)     Micro: Recent Results (from the past 240 hour(s))  URINE CULTURE     Status: Normal   Collection Time   10/31/11 11:46 AM      Component Value Range Status Comment   Culture ESCHERICHIA COLI   Final    Colony Count >=100,000 COLONIES/ML  Final    Organism ID, Bacteria ESCHERICHIA COLI   Final   URINE CULTURE     Status: Normal   Collection Time   11/06/11 10:05 PM      Component Value Range Status Comment   Specimen Description URINE, CLEAN CATCH   Final    Special Requests NONE   Final    Culture  Setup Time [id number]   Final    Colony Count >=100,000 COLONIES/ML   Final    Culture     Final    Value: Multiple bacterial morphotypes present, none predominant. Suggest appropriate recollection if clinically indicated.   Report Status 11/08/2011 FINAL   Final   WET PREP, GENITAL     Status: Abnormal   Collection Time   11/06/11 11:12 PM      Component Value Range Status Comment   Yeast Wet Prep HPF POC NONE SEEN  NONE SEEN  Final    Trich, Wet Prep FEW (*) NONE SEEN  Final    Clue Cells Wet Prep HPF POC FEW (*) NONE SEEN  Final    WBC, Wet Prep HPF POC FEW (*) NONE SEEN  Final     Studies/Results: Dg Chest 2 View  11/06/2011  *RADIOLOGY REPORT*  Clinical Data: Right-sided chest pain, radiating down the right arm.  Shortness of breath.  Abdominal pain and vomiting for 3 weeks.  History of smoking.  CHEST - 2 VIEW  Comparison: None.  Findings: The lungs are well-aerated and clear.  There is no evidence of focal opacification, pleural effusion or pneumothorax.   The heart is normal in size; the mediastinal contour is within normal limits.  No acute osseous abnormalities are seen.  The visualized bowel gas pattern is grossly unremarkable; air and fluid are noted within the stomach.  IMPRESSION: No acute cardiopulmonary process seen.  Original Report Authenticated By: Tonia Ghent, M.D.   Ct Angio Chest W/cm &/or Wo Cm  11/07/2011  *RADIOLOGY REPORT*  Clinical Data:  Mid to upper and lower right-sided abdominal pain; nausea and vomiting.  Fever.  CT ANGIOGRAPHY CHEST CT ABDOMEN AND PELVIS WITH CONTRAST  Technique:  Multidetector CT imaging of the chest was performed using the standard protocol during bolus administration of intravenous contrast.  Multiplanar CT image reconstructions including MIPs were obtained to evaluate the vascular anatomy. Multidetector CT imaging of the abdomen and pelvis was performed using the standard protocol during bolus administration of intravenous contrast.  Contrast: 100 mL of Omnipaque 300 IV contrast  Comparison:  Abdominal ultrasound performed 11/05/2011  CTA CHEST  Findings:  There is no evidence of pulmonary embolus.  Tiny nodular densities along the right minor fissure are thought to reflect lymph nodes.  There are two small 3 mm nodules noted within the right middle lobe (images 43 and 46 of 91).  There is also a 4 mm nodule within the left lower lobe (image 52 of 91).  Though these may be post infectious or post inflammatory in nature, given the findings in the abdomen and pelvis, early metastasis cannot be entirely excluded.  There is no evidence of significant focal consolidation, pleural effusion or pneumothorax.  No abnormal focal contrast enhancement is seen.  The mediastinum is unremarkable in appearance.  Residual thymic tissue is within normal limits.  No pericardial effusion is identified.  No mediastinal lymphadenopathy is seen.  The great vessels are unremarkable in appearance.  No axillary lymphadenopathy is seen.  The  visualized portions of the thyroid gland are unremarkable in  appearance.  No acute osseous abnormalities are seen.   Review of the MIP images confirms the above findings.  IMPRESSION:  1.  No evidence of pulmonary embolus. 2.  A few small bilateral pulmonary nodules are seen, measuring up to 4 mm in size.  Though these may be post infectious or post inflammatory in nature, given the findings in the abdomen and pelvis, early metastatic disease cannot be entirely excluded.  CT ABDOMEN AND PELVIS  Findings: The liver and spleen are unremarkable in appearance. There is a 2.1 cm stone noted lodged along the proximal aspect of the gallbladder.  The gallbladder is otherwise unremarkable in appearance; there is no evidence for obstruction or cholecystitis. The pancreas and adrenal glands are unremarkable.  There is a poorly characterized mass identified along the third and fourth segments of the duodenum, measuring approximately 4.8 x 4.1 x 3.2 cm, with vague additional soft tissue density extending more proximally along the duodenum.  This results in partial obstruction of the duodenum, with significant distension of the first and second segment of the duodenum.  The mass is better characterized on coronal and sagittal images.  No definite retroperitoneal lymphadenopathy is seen; no suspicious peripancreatic or periportal nodes are identified.  The kidneys are unremarkable in appearance.  There is no evidence of hydronephrosis.  No renal or ureteral stones are seen.  No perinephric stranding is appreciated.  The stomach is filled with contrast and is grossly unremarkable in appearance.  No acute vascular abnormalities are seen.  There is a tiny 4 mm node adjacent to the distal abdominal aorta, nonspecific in appearance.  Within the pelvis, note is made of a complex multilobulated mass superior to the vaginal cuff.  This difficult to distinguish fully from the adjacent vaginal cuff, but measures approximately 4.4 x 4.4 x  3.9 cm.  A small amount of associated fluid is seen.  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  Given the irregular heterogeneous appearance of the vaginal cuff, infiltration into the vaginal cuff cannot entirely excluded.  The appendix is normal in caliber, without evidence for appendicitis.  The colon is grossly unremarkable in appearance, though the suspected drop metastases within the pelvis abut the distal sigmoid colon, and may be partially adherent to it.  There appears to be a 1.6 cm omental metastasis noted along the midline below the umbilicus.  No additional omental metastases are seen.  The bladder is mildly distended and grossly unremarkable in appearance.  Mild soft tissue inflammation is noted in the prevesical space, possibly reactive secondary to the lesions described above. The ovaries are relatively superior in position and appear grossly unremarkable; no suspicious adnexal masses are identified.  No inguinal lymphadenopathy is seen.  No acute osseous abnormalities are identified.  Review of the MIP images confirms the above findings.  IMPRESSION:  1.  Poorly characterized 4.8 x 4.1 x 3.2 cm mass noted along the third and fourth segments of the duodenum, with vague additional soft tissue density extending proximally along the duodenum.  This results in partial obstruction of the duodenum, with significant distension of the first and second segments.  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 2.  Complex multilobulated mass superior to the vaginal cuff; altogether, this measures approximately 4.4 x 4.4 x 3.9 cm.  Small amount of associated fluid seen.  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  Infiltration of the underlying vaginal cuff cannot be excluded. 3.  1.6  cm omental metastasis suspected along the midline below the umbilicus; no additional omental metastases seen. 4.  No significant retroperitoneal lymphadenopathy identified. 5.  2.1 cm stone lodged along the proximal aspect of the gallbladder; gallbladder otherwise unremarkable in appearance.  No evidence for obstruction or cholecystitis. 6.  Mild soft tissue inflammation in the prevesical space may be reactive secondary to the lesions described in the pelvis.  These results were called by telephone on 11/07/2011  at  02:08 a.m. to  Dr. Deanna Artis, who verbally acknowledged these results.  Original Report Authenticated By: Tonia Ghent, M.D.   Ct Abdomen Pelvis W Contrast  11/07/2011  *RADIOLOGY REPORT*  Clinical Data:  Mid to upper and lower right-sided abdominal pain; nausea and vomiting.  Fever.  CT ANGIOGRAPHY CHEST CT ABDOMEN AND PELVIS WITH CONTRAST  Technique:  Multidetector CT imaging of the chest was performed using the standard protocol during bolus administration of intravenous contrast.  Multiplanar CT image reconstructions including MIPs were obtained to evaluate the vascular anatomy. Multidetector CT imaging of the abdomen and pelvis was performed using the standard protocol during bolus administration of intravenous contrast.  Contrast: 100 mL of Omnipaque 300 IV contrast  Comparison:  Abdominal ultrasound performed 11/05/2011  CTA CHEST  Findings:  There is no evidence of pulmonary embolus.  Tiny nodular densities along the right minor fissure are thought to reflect lymph nodes.  There are two small 3 mm nodules noted within the right middle lobe (images 43 and 46 of 91).  There is also a 4 mm nodule within the left lower lobe (image 52 of 91).  Though these may be post infectious or post inflammatory in nature, given the findings in the abdomen and pelvis, early metastasis cannot be entirely excluded.  There is no evidence of significant focal consolidation, pleural effusion or  pneumothorax.  No abnormal focal contrast enhancement is seen.  The mediastinum is unremarkable in appearance.  Residual thymic tissue is within normal limits.  No pericardial effusion is identified.  No mediastinal lymphadenopathy is seen.  The great vessels are unremarkable in appearance.  No axillary lymphadenopathy is seen.  The visualized portions of the thyroid gland are unremarkable in appearance.  No acute osseous abnormalities are seen.   Review of the MIP images confirms the above findings.  IMPRESSION:  1.  No evidence of pulmonary embolus. 2.  A few small bilateral pulmonary nodules are seen, measuring up to 4 mm in size.  Though these may be post infectious or post inflammatory in nature, given the findings in the abdomen and pelvis, early metastatic disease cannot be entirely excluded.  CT ABDOMEN AND PELVIS  Findings: The liver and spleen are unremarkable in appearance. There is a 2.1 cm stone noted lodged along the proximal aspect of the gallbladder.  The gallbladder is otherwise unremarkable in appearance; there is no evidence for obstruction or cholecystitis. The pancreas and adrenal glands are unremarkable.  There is a poorly characterized mass identified along the third and fourth segments of the duodenum, measuring approximately 4.8 x 4.1 x 3.2 cm, with vague additional soft tissue density extending more proximally along the duodenum.  This results in partial obstruction of the duodenum, with significant distension of the first and second segment of the duodenum.  The mass is better characterized on coronal and sagittal images.  No definite retroperitoneal lymphadenopathy  is seen; no suspicious peripancreatic or periportal nodes are identified.  The kidneys are unremarkable in appearance.  There is no evidence of hydronephrosis.  No renal or ureteral stones are seen.  No perinephric stranding is appreciated.  The stomach is filled with contrast and is grossly unremarkable in appearance.  No  acute vascular abnormalities are seen.  There is a tiny 4 mm node adjacent to the distal abdominal aorta, nonspecific in appearance.  Within the pelvis, note is made of a complex multilobulated mass superior to the vaginal cuff.  This difficult to distinguish fully from the adjacent vaginal cuff, but measures approximately 4.4 x 4.4 x 3.9 cm.  A small amount of associated fluid is seen.  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  Given the irregular heterogeneous appearance of the vaginal cuff, infiltration into the vaginal cuff cannot entirely excluded.  The appendix is normal in caliber, without evidence for appendicitis.  The colon is grossly unremarkable in appearance, though the suspected drop metastases within the pelvis abut the distal sigmoid colon, and may be partially adherent to it.  There appears to be a 1.6 cm omental metastasis noted along the midline below the umbilicus.  No additional omental metastases are seen.  The bladder is mildly distended and grossly unremarkable in appearance.  Mild soft tissue inflammation is noted in the prevesical space, possibly reactive secondary to the lesions described above. The ovaries are relatively superior in position and appear grossly unremarkable; no suspicious adnexal masses are identified.  No inguinal lymphadenopathy is seen.  No acute osseous abnormalities are identified.  Review of the MIP images confirms the above findings.  IMPRESSION:  1.  Poorly characterized 4.8 x 4.1 x 3.2 cm mass noted along the third and fourth segments of the duodenum, with vague additional soft tissue density extending proximally along the duodenum.  This results in partial obstruction of the duodenum, with significant distension of the first and second segments.  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 2.  Complex multilobulated mass superior to the  vaginal cuff; altogether, this measures approximately 4.4 x 4.4 x 3.9 cm.  Small amount of associated fluid seen.  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  Infiltration of the underlying vaginal cuff cannot be excluded. 3.  1.6 cm omental metastasis suspected along the midline below the umbilicus; no additional omental metastases seen. 4.  No significant retroperitoneal lymphadenopathy identified. 5.  2.1 cm stone lodged along the proximal aspect of the gallbladder; gallbladder otherwise unremarkable in appearance.  No evidence for obstruction or cholecystitis. 6.  Mild soft tissue inflammation in the prevesical space may be reactive secondary to the lesions described in the pelvis.  These results were called by telephone on 11/07/2011  at  02:08 a.m. to  Dr. Deanna Artis, who verbally acknowledged these results.  Original Report Authenticated By: Tonia Ghent, M.D.    Medications: Current facility-administered medications:acetaminophen (TYLENOL) suppository 650 mg, 650 mg, Rectal, Q6H PRN, Therisa Doyne, MD;  acetaminophen (TYLENOL) tablet 650 mg, 650 mg, Oral, Q6H PRN, Therisa Doyne, MD, 650 mg at 11/07/11 2242;  albuterol (PROVENTIL) (5 MG/ML) 0.5% nebulizer solution 2.5 mg, 2.5 mg, Nebulization, Q2H PRN, Therisa Doyne, MD cefTRIAXone (ROCEPHIN) 1 g in dextrose 5 % 50 mL IVPB, 1 g, Intravenous, Q24H, Therisa Doyne, MD, 1  g at 11/08/11 0542;  dextrose 5 % and 0.9 % NaCl with KCl 20 mEq/L infusion, , Intravenous, Continuous, Iva Boop, MD;  feeding supplement (RESOURCE BREEZE) liquid 1 Container, 1 Container, Oral, q12n4p, Rudean Haskell, RD;  LORazepam (ATIVAN) injection 0.5 mg, 0.5 mg, Intravenous, Q8H PRN, Therisa Doyne, MD metroNIDAZOLE (FLAGYL) IVPB 500 mg, 500 mg, Intravenous, Q8H, Therisa Doyne, MD, 500 mg at 11/08/11 0631;  morphine 2 MG/ML  injection 2 mg, 2 mg, Intravenous, Q4H PRN, Therisa Doyne, MD;  ondansetron (ZOFRAN) injection 4 mg, 4 mg, Intravenous, Q6H PRN, Therisa Doyne, MD;  ondansetron (ZOFRAN) tablet 4 mg, 4 mg, Oral, Q6H PRN, Therisa Doyne, MD pantoprazole (PROTONIX) injection 40 mg, 40 mg, Intravenous, QHS, Therisa Doyne, MD, 40 mg at 11/07/11 2245;  sodium chloride 0.9 % injection 3 mL, 3 mL, Intravenous, Q12H, Therisa Doyne, MD, 3 mL at 11/07/11 2248;  zolpidem (AMBIEN) tablet 10 mg, 10 mg, Oral, QHS PRN, Therisa Doyne, MD, 10 mg at 11/07/11 2242;  DISCONTD: 0.9 %  sodium chloride infusion, , Intravenous, Once, Iva Boop, MD DISCONTD: 0.9 % NaCl with KCl 20 mEq/ L  infusion, , Intravenous, Continuous, Therisa Doyne, MD, Last Rate: 100 mL/hr at 11/07/11 9604;  DISCONTD: fentaNYL (SUBLIMAZE) injection, , , PRN, Iva Boop, MD, 25 mcg at 11/07/11 1716;  DISCONTD: iohexol (OMNIPAQUE) 300 MG/ML solution 20 mL, 20 mL, Oral, Q1 Hr x 2, Medication Radiologist, MD, 20 mL at 11/06/11 2216 DISCONTD: midazolam (VERSED) injection, , , PRN, Iva Boop, MD, 2 mg at 11/07/11 1712   Assessment:   *Metastatic cancer  - Gi consult obtained, will follow up on recommendations  - GI oncology called ([phone], Quenton Fetter, GI oncology Dr Donnie Coffin  will be following with the pt once biopsy results received shows adenocarcinoma ) [facility] CONSULTED Active Problems:  Cholelithiasis  - pt is asymptomatic  Hypokalemia  - will continue to supplement  Partial small bowel obstruction  - continue providing supportive care  Hyponatremia  - resolving  Dehydration  - continue IVF  UTI (lower urinary tract infection)  - continue ABX  Trichomonal vaginitis  - new finding dicussed with pt  EDUCATION  - test results and diagnostic studies were discussed with patient and pt's family who was present at the bedside (mother and cousin)  - patient and family have verbalized the understanding  - questions  were answered at the bedside and contact information was provided for additional questions or concerns        LOS: 2 days   [facility] 11/08/2011, 3:30 PM

## 2011-11-08 NOTE — Consult Note (Signed)
[facility] CONSULTATION NOTE  Reason for Consult: [facility]   Kelly Baldwin is an 46 y.o. female initially seen`7with 3 to 4 week history of R lower quadrant pain accompanied by nausea and vomiting as an outpatient by the [facility]. US abdomen revealed cholelithiasis (with no cholecystitis). She was discharged to be readmitted due to persistent abdominal pain. CT A/P with contrast 2/28 revealed a poorly characterized Baldwin along the third and fourth segments of the duodenum, measuring 4.8 x 4.1 x 3.2 cm, with vague additional soft tissue density extending more proximally along the duodenum,causing partial obstruction of the duodenum.  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  No significant retroperitoneal lymphadenopathy identified. Patient underwent EGD by Dr. Leone Baldwin on 11/07/2011. This confirmed the Baldwin in the third portion duodenum with associated stricture/obstruction  Suspicious for carcinoma. biopsies were obtained, with pathology currently pending.In addition, a CT  Angio chest with contrast on 2/28 negative for PE, but positive for the presence of small bilateral pulmonary nodules are seen, measuring up to 4 mm in size of unknown significance due to their size. To see the patient with recommendations regarding her care while awaiting for the report. Surgery has seen the patient as well, but plans are on hold pending on diagnosis.   PMH: Past Medical History  Diagnosis Date  . GERD (gastroesophageal reflux disease)   . Onychomycosis of toenail   . Leg pain   . History of recurrent UTIs   . Hx of hysterectomy  for benign disease     partial  . Shortness of breath   Iron deficiency anemia  Surgeries: Past Surgical History  Procedure Date  . Tubal ligation   . Abdominal hysterectomy 2009    bleeding non cancer partial fibrods  . Ovarian cyst surgery   . Enteroscopy 11/07/2011    Procedure: ENTEROSCOPY;  Surgeon: Kelly Boop, MD;  Location: [facility] ENDOSCOPY;  Service: Endoscopy;  Laterality: N/A;    Allergies: No Known Allergies  Medications:  Prior to Admission:  Prescriptions prior to admission  Medication Sig Dispense Refill  . potassium chloride SA (K-DUR,KLOR-CON) 20 MEQ tablet Take 20 mEq by mouth daily.      . promethazine (PHENERGAN) 25 MG tablet Take 25 mg by mouth every 6 (six) hours as needed. For nausea      . triamterene-hydrochlorothiazide (DYAZIDE) 37.5-25 MG per capsule Take 1 capsule by mouth daily.      Marland Kitchen sulfamethoxazole-trimethoprim (BACTRIM DS,SEPTRA DS) 800-160 MG per tablet Take 1 tablet by mouth 2 (two) times daily.        WUJ:WJXBJYNWGNFAO, acetaminophen, albuterol, LORazepam, morphine, ondansetron (ZOFRAN) IV, ondansetron, zolpidem, DISCONTD: fentaNYL, DISCONTD: midazolam  ROS: Constitutional: Positive for 10 lb  weight loss over the last 4 weeks. Negative for fever, chills but positive for malaise/fatigue.  Eyes: Negative for blurred vision and double vision.  Respiratory: Negative for cough, hemoptysis and shortness of breath.  Cardiovascular: Negative for chest pain. GI: Positive for nausea, vomiting about 1 hour after consuming solids and liquids, diarrhea, but constipation. No change in bowel caliber. No  Melena or hematochezia. She had abdominal  discomfort  one week prior to admission , worsening on presentation. Occasional heartburn. GU: No blood in urine. No loss of urinary control. Shehad urinary frequency and urgency one week prior to the admission Skin: Negative for itching. No rash. No petechia. No bruising Neurological: No headaches. No motor or  sensory deficits.  Health maintenance: her last MGM was performed on 09/04/2010 with known R benign breast nodule initially seen in 2002. Patient failed to follow up on MGM on 2012. Last PAP 10/2007, negative  Family History:  Family History  Problem Relation Age of Onset  . Diabetes Mother     67  . Hypertension Mother   . Glaucoma Mother   . Diabetes Father     68  . Hyperlipidemia Father   . Hypertension Father   . Coronary artery disease Father   . Colon cancer Father   . Colon cancer Paternal Grandfather     GF paternal father has polyps    Social History: The patient is married she has 12th grade education . She works at [employer]. reports that she has quit smoking. She has never used smokeless tobacco. She reports that she drinks about 1.8 ounces of alcohol per week. She reports that she does not use illicit drugs.Patient is single, 2 children.   Physical Exam  10 year old  in no acute distress A. and O. x3 General well-developed and well-nourished  HEENT: Normocephalic, atraumatic, PERRLA. Oral cavity without thrush or lesions. Neck supple. no thyromegaly, no cervical or supraclavicular adenopathy  Lungs slightly decreased breath sounds on the R base . No wheezing, rhonchi or rales. No axillary masses. Breasts: not examined. Cardiac regular rate and rhythm normal S1-S2, no murmur , rubs or gallops Abdomen soft nontender , bowel sounds x4. No HSM GU/rectal: deferred. Extremities no clubbing cyanosis or edema. No bruising or petechial rash     Labs:  CBC   Lab 11/08/11 0645 11/07/11 0700 11/06/11 2142  WBC 14.0* 11.2* 11.5*  HGB 11.6* 13.2 15.0  HCT 33.4* 37.0 41.2  PLT 306 337 359  MCV 82.1 81.1 81.3  MCH 28.5 28.9 29.6  MCHC 34.7 35.7 36.4*  RDW 14.2 14.1 14.1  LYMPHSABS -- -- 2.6  MONOABS -- -- 0.7  EOSABS -- -- 0.3  BASOSABS -- -- 0.1  BANDABS -- -- --       CMP    Lab 11/08/11 0645 11/07/11 0700 11/06/11 2142  NA 135 134* 133*  K 4.1 3.0*  3.4*  CL 103 98 95*  CO2 25 24 26   GLUCOSE 93 100* 121*  BUN 11 16 20   CREATININE 0.90 0.96 1.14*  CALCIUM 9.3 9.6 10.7*  MG 2.1 -- --  AST -- 16 17  ALT -- 16 18  ALKPHOS -- 104 112  BILITOT -- 1.1 0.8        Component Value Date/Time   BILITOT 1.1 11/07/2011 0700   BILIDIR 0.1 10/31/2011 1146      No results found for this basename: INR:5,PROTIME:5 in the last 168 hours   Basename 11/06/11 2142  DDIMER 0.69*      Imaging Studies:  Ct Angio Chest W/cm &/or Wo Cm  11/07/2011  *RADIOLOGY REPORT*  Clinical Data:  Mid to upper and lower right-sided abdominal pain; nausea and vomiting.  Fever.  CT ANGIOGRAPHY CHEST CT ABDOMEN AND PELVIS WITH CONTRAST  Technique:  Multidetector CT imaging of the chest was performed using the standard protocol during bolus administration of intravenous contrast.  Multiplanar CT image  reconstructions including MIPs were obtained to evaluate the vascular anatomy. Multidetector CT imaging of the abdomen and pelvis was performed using the standard protocol during bolus administration of intravenous contrast.  Contrast: 100 mL of Omnipaque 300 IV contrast  Comparison:  Abdominal ultrasound performed 11/05/2011  CTA CHEST  Findings:  There is no evidence of pulmonary embolus.  Tiny nodular densities along the right minor fissure are thought to reflect lymph nodes.  There are two small 3 mm nodules noted within the right middle lobe (images 43 and 46 of 91).  There is also a 4 mm nodule within the left lower lobe (image 52 of 91).  Though these may be post infectious or post inflammatory in nature, given the findings in the abdomen and pelvis, early metastasis cannot be entirely excluded.  There is no evidence of significant focal consolidation, pleural effusion or pneumothorax.  No abnormal focal contrast enhancement is seen.  The mediastinum is unremarkable in appearance.  Residual thymic tissue is within normal limits.  No pericardial effusion is identified.   No mediastinal lymphadenopathy is seen.  The great vessels are unremarkable in appearance.  No axillary lymphadenopathy is seen.  The visualized portions of the thyroid gland are unremarkable in appearance.  No acute osseous abnormalities are seen.   Review of the MIP images confirms the above findings.  IMPRESSION:  1.  No evidence of pulmonary embolus. 2.  A few small bilateral pulmonary nodules are seen, measuring up to 4 mm in size.  Though these may be post infectious or post inflammatory in nature, given the findings in the abdomen and pelvis, early metastatic disease cannot be entirely excluded.  CT ABDOMEN AND PELVIS  Findings: The liver and spleen are unremarkable in appearance. There is a 2.1 cm stone noted lodged along the proximal aspect of the gallbladder.  The gallbladder is otherwise unremarkable in appearance; there is no evidence for obstruction or cholecystitis. The pancreas and adrenal glands are unremarkable.  There is a poorly characterized Baldwin identified along the third and fourth segments of the duodenum, measuring approximately 4.8 x 4.1 x 3.2 cm, with vague additional soft tissue density extending more proximally along the duodenum.  This results in partial obstruction of the duodenum, with significant distension of the first and second segment of the duodenum.  The Baldwin is better characterized on coronal and sagittal images.  No definite retroperitoneal lymphadenopathy is seen; no suspicious peripancreatic or periportal nodes are identified.  The kidneys are unremarkable in appearance.  There is no evidence of hydronephrosis.  No renal or ureteral stones are seen.  No perinephric stranding is appreciated.  The stomach is filled with contrast and is grossly unremarkable in appearance.  No acute vascular abnormalities are seen.  There is a tiny 4 mm node adjacent to the distal abdominal aorta, nonspecific in appearance.  Within the pelvis, note is made of a complex multilobulated Baldwin  superior to the vaginal cuff.  This difficult to distinguish fully from the adjacent vaginal cuff, but measures approximately 4.4 x 4.4 x 3.9 cm.  A small amount of associated fluid is seen.  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  Given the irregular heterogeneous appearance of the vaginal cuff, infiltration into the vaginal cuff cannot entirely excluded.  The appendix is normal in caliber, without evidence for appendicitis.  The colon is grossly unremarkable in appearance, though the suspected drop  metastases within the pelvis abut the distal sigmoid colon, and may be partially adherent to it.  There appears to be a 1.6 cm omental metastasis noted along the midline below the umbilicus.  No additional omental metastases are seen.  The bladder is mildly distended and grossly unremarkable in appearance.  Mild soft tissue inflammation is noted in the prevesical space, possibly reactive secondary to the lesions described above. The ovaries are relatively superior in position and appear grossly unremarkable; no suspicious adnexal masses are identified.  No inguinal lymphadenopathy is seen.  No acute osseous abnormalities are identified.  Review of the MIP images confirms the above findings.  IMPRESSION:  1.  Poorly characterized 4.8 x 4.1 x 3.2 cm Baldwin noted along the third and fourth segments of the duodenum, with vague additional soft tissue density extending proximally along the duodenum.  This results in partial obstruction of the duodenum, with significant distension of the first and second segments.  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 2.  Complex multilobulated Baldwin superior to the vaginal cuff; altogether, this measures approximately 4.4 x 4.4 x 3.9 cm.  Small amount of associated fluid seen.  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  Infiltration of the underlying vaginal cuff cannot be excluded. 3.  1.6 cm omental metastasis suspected along the midline below the umbilicus; no additional omental metastases seen. 4.  No significant retroperitoneal lymphadenopathy identified. 5.  2.1 cm stone lodged along the proximal aspect of the gallbladder; gallbladder otherwise unremarkable in appearance.  No evidence for obstruction or cholecystitis. 6.  Mild soft tissue inflammation in the prevesical space may be reactive secondary to the lesions described in the pelvis.  These results were called by telephone on 11/07/2011  at  02:08 a.m. to  Dr. Deanna Artis, who verbally acknowledged these results.  Original Report Authenticated By: Tonia Ghent, M.D.        A/P: 46 y.o. female asked to see for evaluation of duodenal Baldwin suspicious for neoplasm. Patient is s/p EGD with biopsies. Results are currently pending. Once available, Dr. Donnie Coffin is to proceed with recommendations regarding her care and further workup studies. Thank you for the consultation.   Marlowe Kays E 11/08/2011 12:43 PM    Attending note  46 yo AAF with large duodenal Baldwin and drop metastases to vaginal cuff as well as omental mets. Biopsy shows adenocarcinoma. The pt does have  + ve family hx for colon ca in a paternal grandmother and polyps in her father. The patient has not had a colonoscopy.  Her exam does not reveal adenopathy, or organomegaly and breasts are normal. Dr Gerrit Friends was in to see the pt at the same time. He discussed a bypass procedure ie gastrojejunostomy.  I plan to proceed with a PET scan . If there is no disease above the diaphram, she might be a good candidate for an aggressive debulking procedure, which might require gyne-onc involvement,.in addition to cholecystectomy and port placement. The pts prognosis is guarded, but given her young age I would opt to be  aggressive. She would benefit from post-op chemotherapy.  Pierce Crane MD

## 2011-11-08 NOTE — Progress Notes (Signed)
INITIAL ADULT NUTRITION ASSESSMENT Date: 11/08/2011   Time: 10:10 AM Reason for Assessment: Nutrition Risk  ASSESSMENT: Female 46 y.o.  Dx: Metastatic cancer  Hx:  Past Medical History  Diagnosis Date  . GERD (gastroesophageal reflux disease)   . Onychomycosis of toenail   . Leg pain   . History of recurrent UTIs   . Hx of hysterectomy for benign disease     partial  . Shortness of breath     Related Meds:     . cefTRIAXone (ROCEPHIN) IVPB 1 gram/50 mL D5W  1 g Intravenous Q24H  . metronidazole  500 mg Intravenous Q8H  . pantoprazole (PROTONIX) IV  40 mg Intravenous QHS  . potassium chloride  10 mEq Intravenous Q1 Hr x 4  . sodium chloride  3 mL Intravenous Q12H  . DISCONTD: sodium chloride   Intravenous Once  . DISCONTD: iohexol  20 mL Oral Q1 Hr x 2     Ht: 5\' 6"  (167.6 cm)  Wt: 188 lb (85.276 kg)  Ideal Wt: 59.1 kg % Ideal Wt: 144%  Usual Wt: 200 lbs (90.9 kg) % Usual Wt: 94%  Body mass index is 30.34 kg/(m^2). obesity   Food/Nutrition Related Hx: Patient reports N/V for about 3 weeks, with solids or liquids. Has had weight loss, reports her MD says its about 12 lbs, but does not weigh at home, does not really know a usual body weight.   Labs:  CMP     Component Value Date/Time   NA 135 11/08/2011 0645   K 4.1 11/08/2011 0645   CL 103 11/08/2011 0645   CO2 25 11/08/2011 0645   GLUCOSE 93 11/08/2011 0645   BUN 11 11/08/2011 0645   CREATININE 0.90 11/08/2011 0645   CALCIUM 9.3 11/08/2011 0645   PROT 7.8 11/07/2011 0700   ALBUMIN 3.7 11/07/2011 0700   AST 16 11/07/2011 0700   ALT 16 11/07/2011 0700   ALKPHOS 104 11/07/2011 0700   BILITOT 1.1 11/07/2011 0700   GFRNONAA 76* 11/08/2011 0645   GFRAA 88* 11/08/2011 0645     Intake/Output Summary (Last 24 hours) at 11/08/11 1013 Last data filed at 11/08/11 0500  Gross per 24 hour  Intake      0 ml  Output    300 ml  Net   -300 ml     Diet Order: Clear Liquid  Supplements/Tube Feeding: none  IVF:    dextrose 5 %  and 0.9 % NaCl with KCl 20 mEq/L   DISCONTD: 0.9 % NaCl with KCl 20 mEq / L Last Rate: 100 mL/hr at 11/07/11 1610    Estimated Nutritional Needs:   Kcal: 1750-2000 Protein: 85- 100 gm Fluid: 1.7 - 1.9 L  Patient with new dx of duodenal cancer, mets in pelvis, possibly in lungs. Waiting for oncology input. Patient has 6% weight loss in about 3 weeks per patients report. Some weight loss is likely attributed to dehydration from frequent vomiting after solids and liquids. Patient also reports that she has not eaten anything for the past 3 days and prior to that everything she ate she vomited. Patient appears well nourished with no fat or muscle wasting. Patient is at high risk for malnutrition.  Patient is on clear liquid diet at this time, family asked many questions about foods they could provide, wanting to bring in broth or other items for patient. Patient is willing to drink Resource Breeze for calorie and protein intake.  Per notes, TPN is possible.  NUTRITION DIAGNOSIS: -  Inadequate oral intake (NI-2.1).  Status: Ongoing  RELATED TO: N/V  AS EVIDENCE BY: poor po intake PTA  MONITORING/EVALUATION(Goals): Goal: Patient will start a PO diet or alternate means of nutrition with in 3-5 days Monitor: Diet advance, initiation of TPN, weight, labs, N/V, I/O's  EDUCATION NEEDS: -No education needs identified at this time  INTERVENTION: 1. Add Resource Breeze BID  2. Recommend that if MD feels patient will not be able to start a PO diet, or that PO diet will not be able to meet needs due to obstruction, start TPN to prevent malnutrition  Dietitian [phone]  DOCUMENTATION CODES Per approved criteria  -Obesity Unspecified    KOWALSKI, Jonesha Tsuchiya MARIE 11/08/2011, 10:10 AM

## 2011-11-08 NOTE — Consult Note (Signed)
Reason for Consult:Obstructing duodenal mass Consulting Surgeon: Gerrit Friends Referring Physician: Susie Cassette   HPI: Kelly Baldwin is an 46 y.o. female who presented with  RLQ pain and 3-4 weeks of Nausea and vomiting. Initialy thought her gall bladder was involved. US done on 26 showed cholelithiasis but no cholecystitis. 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 Patient was admitted for further evaluation and management. She has since had EGD which finds mass of duodenum to be near completely obstructing, biopsy obtained. Surgery consult requested.   Past Medical History:  Past Medical History  Diagnosis Date  . GERD (gastroesophageal reflux disease)   . Onychomycosis of toenail   . Leg pain   . History of recurrent UTIs   . Hx of hysterectomy for benign disease     partial  . Shortness of breath     Surgical History:  Past Surgical History  Procedure Date  . Tubal ligation   . Abdominal hysterectomy 2009    bleeding non cancer partial fibroids  . Ovarian cyst surgery   . Enteroscopy 11/07/2011    Procedure: ENTEROSCOPY;  Surgeon: Iva Boop, MD;  Location: [facility] ENDOSCOPY;  Service: Endoscopy;  Laterality: N/A;    Family History:  Family History  Problem Relation Age of Onset  . Diabetes Mother     75  . Hypertension Mother   . Glaucoma Mother   . Diabetes Father     62  . Hyperlipidemia Father   . Hypertension Father   . Coronary artery disease Father   . Colon cancer Father   . Colon cancer Paternal Grandfather     GF paternal father has polyps    Social History:  reports that she has quit smoking. She has never used smokeless tobacco. She reports that she drinks about 1.8 ounces of alcohol per week. She reports that she does not use illicit drugs.  Allergies: No Known Allergies  Medications:    Prior to Admission:  Prescriptions prior to admission  Medication Sig Dispense Refill  . potassium chloride SA (K-DUR,KLOR-CON) 20 MEQ tablet Take 20 mEq by mouth daily.      . promethazine (PHENERGAN) 25 MG tablet Take 25 mg by mouth every 6 (six) hours as needed. For nausea      . triamterene-hydrochlorothiazide (DYAZIDE) 37.5-25 MG per capsule Take 1 capsule by mouth daily.      Marland Kitchen sulfamethoxazole-trimethoprim (BACTRIM DS,SEPTRA DS) 800-160 MG per tablet Take 1 tablet by mouth 2 (two) times daily.        ROS: See HPI for pertinent findings, otherwise complete 10 system review negative.  Physical Exam: Blood pressure 102/72, pulse 88, temperature 98.6 F (37 C), temperature source Oral, resp. rate 16, height 5\' 6"  (1.676 m), weight 85.276 kg (188 lb), SpO2 98.00%.  General Appearance:  Alert, cooperative, no distress, appears stated age  Head:  Normocephalic, without obvious abnormality, atraumatic  ENT: Unremarkable  Neck: Supple, symmetrical, trachea midline, no adenopathy, thyroid: not enlarged, symmetric, no tenderness/mass/nodules  Lungs:   Clear to auscultation bilaterally, no w/r/r, respirations unlabored without use of accessory muscles.  Chest Wall:  No tenderness or deformity  Heart:  Regular rate and rhythm, S1, S2 normal, no murmur, rub or gallop. Carotids 2+ without bruit.  Abdomen:   Soft, non-tender, non distended. Bowel sounds active all four quadrants,  no  masses, no organomegaly.  Genitalia:  Normal. No hernias  Rectal:  Deferred.  Extremities: Extremities normal, atraumatic, no cyanosis or edema  Pulses: 2+ and symmetric  Skin: Skin color, texture, turgor normal, no rashes or lesions  Neurologic: Normal affect, no gross deficits.     Labs: CBC  Basename 11/08/11 0645 11/07/11 0700  WBC 14.0* 11.2*  HGB 11.6* 13.2  HCT 33.4* 37.0  PLT 306 337   MET  Basename 11/08/11 0645 11/07/11 0700  NA 135 134*  K 4.1 3.0*  CL 103 98  CO2 25 24  GLUCOSE 93  100*  BUN 11 16  CREATININE 0.90 0.96  CALCIUM 9.3 9.6    Basename 11/07/11 0700  PROT 7.8  ALBUMIN 3.7  AST 16  ALT 16  ALKPHOS 104  BILITOT 1.1  BILIDIR --  IBILI --  LIPASE --   PT/INR No results found for this basename: LABPROT:2,INR:2 in the last 72 hours ABG No results found for this basename: PHART:2,PCO2:2,PO2:2,HCO3:2 in the last 72 hours    Dg Chest 2 View  11/06/2011  *RADIOLOGY REPORT*  Clinical Data: Right-sided chest pain, radiating down the right arm.  Shortness of breath.  Abdominal pain and vomiting for 3 weeks.  History of smoking.  CHEST - 2 VIEW  Comparison: None.  Findings: The lungs are well-aerated and clear.  There is no evidence of focal opacification, pleural effusion or pneumothorax.  The heart is normal in size; the mediastinal contour is within normal limits.  No acute osseous abnormalities are seen.  The visualized bowel gas pattern is grossly unremarkable; air and fluid are noted within the stomach.  IMPRESSION: No acute cardiopulmonary process seen.  Original Report Authenticated By: Tonia Ghent, M.D.   Ct Angio Chest W/cm &/or Wo Cm  11/07/2011  *RADIOLOGY REPORT*  Clinical Data:  Mid to upper and lower right-sided abdominal pain; nausea and vomiting.  Fever.  CT ANGIOGRAPHY CHEST CT ABDOMEN AND PELVIS WITH CONTRAST  Technique:  Multidetector CT imaging of the chest was performed using the standard protocol during bolus administration of intravenous contrast.  Multiplanar CT image reconstructions including MIPs were obtained to evaluate the vascular anatomy. Multidetector CT imaging of the abdomen and pelvis was performed using the standard protocol during bolus administration of intravenous contrast.  Contrast: 100 mL of Omnipaque 300 IV contrast  Comparison:  Abdominal ultrasound performed 11/05/2011  CTA CHEST  Findings:  There is no evidence of pulmonary embolus.  Tiny nodular densities along the right minor fissure are thought to reflect lymph  nodes.  There are two small 3 mm nodules noted within the right middle lobe (images 43 and 46 of 91).  There is also a 4 mm nodule within the left lower lobe (image 52 of 91).  Though these may be post infectious or post inflammatory in nature, given the findings in the abdomen and pelvis, early metastasis cannot be entirely excluded.  There is no evidence of significant focal consolidation, pleural effusion or pneumothorax.  No abnormal focal contrast enhancement is seen.  The mediastinum is unremarkable in appearance.  Residual thymic tissue is within normal limits.  No pericardial effusion is identified.  No mediastinal lymphadenopathy is seen.  The great vessels are unremarkable in appearance.  No axillary lymphadenopathy is seen.  The visualized portions of the thyroid gland are unremarkable in appearance.  No acute osseous abnormalities are seen.   Review of the MIP images confirms the above findings.  IMPRESSION:  1.  No evidence of  pulmonary embolus. 2.  A few small bilateral pulmonary nodules are seen, measuring up to 4 mm in size.  Though these may be post infectious or post inflammatory in nature, given the findings in the abdomen and pelvis, early metastatic disease cannot be entirely excluded.  CT ABDOMEN AND PELVIS  Findings: The liver and spleen are unremarkable in appearance. There is a 2.1 cm stone noted lodged along the proximal aspect of the gallbladder.  The gallbladder is otherwise unremarkable in appearance; there is no evidence for obstruction or cholecystitis. The pancreas and adrenal glands are unremarkable.  There is a poorly characterized mass identified along the third and fourth segments of the duodenum, measuring approximately 4.8 x 4.1 x 3.2 cm, with vague additional soft tissue density extending more proximally along the duodenum.  This results in partial obstruction of the duodenum, with significant distension of the first and second segment of the duodenum.  The mass is better  characterized on coronal and sagittal images.  No definite retroperitoneal lymphadenopathy is seen; no suspicious peripancreatic or periportal nodes are identified.  The kidneys are unremarkable in appearance.  There is no evidence of hydronephrosis.  No renal or ureteral stones are seen.  No perinephric stranding is appreciated.  The stomach is filled with contrast and is grossly unremarkable in appearance.  No acute vascular abnormalities are seen.  There is a tiny 4 mm node adjacent to the distal abdominal aorta, nonspecific in appearance.  Within the pelvis, note is made of a complex multilobulated mass superior to the vaginal cuff.  This difficult to distinguish fully from the adjacent vaginal cuff, but measures approximately 4.4 x 4.4 x 3.9 cm.  A small amount of associated fluid is seen.  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  Given the irregular heterogeneous appearance of the vaginal cuff, infiltration into the vaginal cuff cannot entirely excluded.  The appendix is normal in caliber, without evidence for appendicitis.  The colon is grossly unremarkable in appearance, though the suspected drop metastases within the pelvis abut the distal sigmoid colon, and may be partially adherent to it.  There appears to be a 1.6 cm omental metastasis noted along the midline below the umbilicus.  No additional omental metastases are seen.  The bladder is mildly distended and grossly unremarkable in appearance.  Mild soft tissue inflammation is noted in the prevesical space, possibly reactive secondary to the lesions described above. The ovaries are relatively superior in position and appear grossly unremarkable; no suspicious adnexal masses are identified.  No inguinal lymphadenopathy is seen.  No acute osseous abnormalities are identified.  Review of the MIP images confirms the above findings.   IMPRESSION:  1.  Poorly characterized 4.8 x 4.1 x 3.2 cm mass noted along the third and fourth segments of the duodenum, with vague additional soft tissue density extending proximally along the duodenum.  This results in partial obstruction of the duodenum, with significant distension of the first and second segments.  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 2.  Complex multilobulated mass superior to the vaginal cuff; altogether, this measures approximately 4.4 x 4.4 x 3.9 cm.  Small amount of associated fluid seen.  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  Infiltration of the underlying vaginal cuff cannot be excluded. 3.  1.6 cm omental  metastasis suspected along the midline below the umbilicus; no additional omental metastases seen. 4.  No significant retroperitoneal lymphadenopathy identified. 5.  2.1 cm stone lodged along the proximal aspect of the gallbladder; gallbladder otherwise unremarkable in appearance.  No evidence for obstruction or cholecystitis. 6.  Mild soft tissue inflammation in the prevesical space may be reactive secondary to the lesions described in the pelvis.  These results were called by telephone on 11/07/2011  at  02:08 a.m. to  Dr. Deanna Artis, who verbally acknowledged these results.  Original Report Authenticated By: Tonia Ghent, M.D.   Ct Abdomen Pelvis W Contrast  11/07/2011  *RADIOLOGY REPORT*  Clinical Data:  Mid to upper and lower right-sided abdominal pain; nausea and vomiting.  Fever.  CT ANGIOGRAPHY CHEST CT ABDOMEN AND PELVIS WITH CONTRAST  Technique:  Multidetector CT imaging of the chest was performed using the standard protocol during bolus administration of intravenous contrast.  Multiplanar CT image reconstructions including MIPs were obtained to evaluate the vascular anatomy. Multidetector CT imaging of the abdomen and pelvis was  performed using the standard protocol during bolus administration of intravenous contrast.  Contrast: 100 mL of Omnipaque 300 IV contrast  Comparison:  Abdominal ultrasound performed 11/05/2011  CTA CHEST  Findings:  There is no evidence of pulmonary embolus.  Tiny nodular densities along the right minor fissure are thought to reflect lymph nodes.  There are two small 3 mm nodules noted within the right middle lobe (images 43 and 46 of 91).  There is also a 4 mm nodule within the left lower lobe (image 52 of 91).  Though these may be post infectious or post inflammatory in nature, given the findings in the abdomen and pelvis, early metastasis cannot be entirely excluded.  There is no evidence of significant focal consolidation, pleural effusion or pneumothorax.  No abnormal focal contrast enhancement is seen.  The mediastinum is unremarkable in appearance.  Residual thymic tissue is within normal limits.  No pericardial effusion is identified.  No mediastinal lymphadenopathy is seen.  The great vessels are unremarkable in appearance.  No axillary lymphadenopathy is seen.  The visualized portions of the thyroid gland are unremarkable in appearance.  No acute osseous abnormalities are seen.   Review of the MIP images confirms the above findings.  IMPRESSION:  1.  No evidence of pulmonary embolus. 2.  A few small bilateral pulmonary nodules are seen, measuring up to 4 mm in size.  Though these may be post infectious or post inflammatory in nature, given the findings in the abdomen and pelvis, early metastatic disease cannot be entirely excluded.  CT ABDOMEN AND PELVIS  Findings: The liver and spleen are unremarkable in appearance. There is a 2.1 cm stone noted lodged along the proximal aspect of the gallbladder.  The gallbladder is otherwise unremarkable in appearance; there is no evidence for obstruction or cholecystitis. The pancreas and adrenal glands are unremarkable.  There is a poorly characterized mass  identified along the third and fourth segments of the duodenum, measuring approximately 4.8 x 4.1 x 3.2 cm, with vague additional soft tissue density extending more proximally along the duodenum.  This results in partial obstruction of the duodenum, with significant distension of the first and second segment of the duodenum.  The mass is better characterized on coronal and sagittal images.  No definite retroperitoneal lymphadenopathy is seen; no suspicious peripancreatic or periportal nodes are identified.  The kidneys are unremarkable in appearance.  There is no evidence of hydronephrosis.  No  renal or ureteral stones are seen.  No perinephric stranding is appreciated.  The stomach is filled with contrast and is grossly unremarkable in appearance.  No acute vascular abnormalities are seen.  There is a tiny 4 mm node adjacent to the distal abdominal aorta, nonspecific in appearance.  Within the pelvis, note is made of a complex multilobulated mass superior to the vaginal cuff.  This difficult to distinguish fully from the adjacent vaginal cuff, but measures approximately 4.4 x 4.4 x 3.9 cm.  A small amount of associated fluid is seen.  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  Given the irregular heterogeneous appearance of the vaginal cuff, infiltration into the vaginal cuff cannot entirely excluded.  The appendix is normal in caliber, without evidence for appendicitis.  The colon is grossly unremarkable in appearance, though the suspected drop metastases within the pelvis abut the distal sigmoid colon, and may be partially adherent to it.  There appears to be a 1.6 cm omental metastasis noted along the midline below the umbilicus.  No additional omental metastases are seen.  The bladder is mildly distended and grossly unremarkable in appearance.  Mild soft tissue inflammation is noted in the  prevesical space, possibly reactive secondary to the lesions described above. The ovaries are relatively superior in position and appear grossly unremarkable; no suspicious adnexal masses are identified.  No inguinal lymphadenopathy is seen.  No acute osseous abnormalities are identified.  Review of the MIP images confirms the above findings.  IMPRESSION:  1.  Poorly characterized 4.8 x 4.1 x 3.2 cm mass noted along the third and fourth segments of the duodenum, with vague additional soft tissue density extending proximally along the duodenum.  This results in partial obstruction of the duodenum, with significant distension of the first and second segments.  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 2.  Complex multilobulated mass superior to the vaginal cuff; altogether, this measures approximately 4.4 x 4.4 x 3.9 cm.  Small amount of associated fluid seen.  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  Infiltration of the underlying vaginal cuff cannot be excluded. 3.  1.6 cm omental metastasis suspected along the midline below the umbilicus; no additional omental metastases seen. 4.  No significant retroperitoneal lymphadenopathy identified. 5.  2.1 cm stone lodged along the proximal aspect of the gallbladder; gallbladder otherwise unremarkable in appearance.  No evidence for obstruction or cholecystitis. 6.  Mild soft tissue inflammation in the prevesical space may be reactive secondary to the lesions described in the pelvis.  These results were called by telephone on 11/07/2011  at  02:08 a.m. to  Dr. Deanna Artis, who verbally acknowledged these results.  Original Report Authenticated By: Tonia Ghent, M.D.    Assessment/Plan: Principal Problem:  Duodenal mass, with near complete GOO Poss drop nets to omentum and pelvis Poss pulm mets by CT Active Problems:  Cholelithiasis   Hypokalemia  Hyponatremia  Dehydration  UTI (lower urinary tract infection)  Met with pt and family and explained potential surgical role. Await biopsies and Med Onc input. Clears diet ordered, but she may not tolerate. Will discuss with MD and follow along to make definitive surgical decision.   Marianna Fuss PA-C 11/08/2011, 11:38 AM

## 2011-11-08 NOTE — Progress Notes (Signed)
Pathology results show adenocarcinoma.  Dr. Frederica Kuster told me it was consistent with primary duodenal cancer.  Await oncology input to determine how to proceed with her treatment.  Iva Boop, MD, [facility] [phone] (pager) 11/08/2011 2:49 PM

## 2011-11-08 NOTE — Progress Notes (Signed)
[facility] Daily Rounding Note 11/08/2011, 8:28 AM  SUBJECTIVE:       Tolerating clears.  No n/v.  Never has had abd pain.  Passing flatus but no stools.  OBJECTIVE:        General: tired looking but not unwell.     Vital signs in last 24 hours:    Temp:  [97.3 F (36.3 C)-99 F (37.2 C)] 98.6 F (37 C) (03/01 0441) Pulse Rate:  [88-97] 88  (03/01 0441) Resp:  [9-38] 16  (03/01 0441) BP: (97-146)/(61-81) 102/72 mmHg (03/01 0441) SpO2:  [97 %-100 %] 98 % (03/01 0441) Weight:  [188 lb (85.276 kg)] 188 lb (85.276 kg) (02/28 1528) Last BM Date:  (LBM was 3 weeks ago per pt)  Heart: RRR Chest: Clear but BS in upper right lung diminished Abdomen: Soft, NT, ND.  Active BS  Extremities: no edema Neuro/Psych:  Pleasant, quiet/subdued.    Intake/Output from previous day: 02/28 0701 - 03/01 0700 In: -  Out: 300 [Urine:300]  Intake/Output this shift:    Lab Results:  Ca 125    29.5  Basename 11/08/11 0645 11/07/11 0700 11/06/11 2142  WBC 14.0* 11.2* 11.5*  HGB 11.6* 13.2 15.0  HCT 33.4* 37.0 41.2  PLT 306 337 359   BMET  Basename 11/08/11 0645 11/07/11 0700 11/06/11 2142  NA 135 134* 133*  K 4.1 3.0* 3.4*  CL 103 98 95*  CO2 25 24 26   GLUCOSE 93 100* 121*  BUN 11 16 20   CREATININE 0.90 0.96 1.14*  CALCIUM 9.3 9.6 10.7*   LFT  Basename 11/07/11 0700 11/06/11 2142  PROT 7.8 8.5*  ALBUMIN 3.7 4.1  AST 16 17  ALT 16 18  ALKPHOS 104 112  BILITOT 1.1 0.8  BILIDIR -- --  IBILI -- --   Studies/Results: Dg Chest 2 View  11/06/2011  *RADIOLOGY REPORT*  Clinical Data: Right-sided chest pain, radiating down the right arm.  Shortness of breath.  Abdominal pain and vomiting for 3 weeks.  History of smoking.  CHEST - 2 VIEW  Comparison: None.  Findings: The lungs are well-aerated and clear.  There is no evidence of focal opacification, pleural effusion or pneumothorax.  The heart is normal in size; the mediastinal contour is within normal limits.  No acute osseous  abnormalities are seen.  The visualized bowel gas pattern is grossly unremarkable; air and fluid are noted within the stomach.  IMPRESSION: No acute cardiopulmonary process seen.  Original Report Authenticated By: Tonia Ghent, M.D.   Ct Abdomen Pelvis W Contrast  11/07/2011  *RADIOLOGY REPORT*  Clinical Data:  Mid to upper and lower right-sided abdominal pain; nausea and vomiting.  Fever.  CT ANGIOGRAPHY CHEST CT ABDOMEN AND PELVIS WITH CONTRAST  Technique:  Multidetector CT imaging of the chest was performed using the standard protocol during bolus administration of intravenous contrast.  Multiplanar CT image reconstructions including MIPs were obtained to evaluate the vascular anatomy. Multidetector CT imaging of the abdomen and pelvis was performed using the standard protocol during bolus administration of intravenous contrast.  Contrast: 100 mL of Omnipaque 300 IV contrast  Comparison:  Abdominal ultrasound performed 11/05/2011  CTA CHEST  Findings:  There is no evidence of pulmonary embolus.  Tiny nodular densities along the right minor fissure are thought to reflect lymph nodes.  There are two small 3 mm nodules noted within the right middle lobe (images 43 and 46 of 91).  There is also a 4 mm nodule  within the left lower lobe (image 52 of 91).  Though these may be post infectious or post inflammatory in nature, given the findings in the abdomen and pelvis, early metastasis cannot be entirely excluded.  There is no evidence of significant focal consolidation, pleural effusion or pneumothorax.  No abnormal focal contrast enhancement is seen.  The mediastinum is unremarkable in appearance.  Residual thymic tissue is within normal limits.  No pericardial effusion is identified.  No mediastinal lymphadenopathy is seen.  The great vessels are unremarkable in appearance.  No axillary lymphadenopathy is seen.  The visualized portions of the thyroid gland are unremarkable in appearance.  No acute osseous  abnormalities are seen.   Review of the MIP images confirms the above findings.  IMPRESSION:  1.  No evidence of pulmonary embolus. 2.  A few small bilateral pulmonary nodules are seen, measuring up to 4 mm in size.  Though these may be post infectious or post inflammatory in nature, given the findings in the abdomen and pelvis, early metastatic disease cannot be entirely excluded.  CT ABDOMEN AND PELVIS  Findings: The liver and spleen are unremarkable in appearance. There is a 2.1 cm stone noted lodged along the proximal aspect of the gallbladder.  The gallbladder is otherwise unremarkable in appearance; there is no evidence for obstruction or cholecystitis. The pancreas and adrenal glands are unremarkable.  There is a poorly characterized mass identified along the third and fourth segments of the duodenum, measuring approximately 4.8 x 4.1 x 3.2 cm, with vague additional soft tissue density extending more proximally along the duodenum.  This results in partial obstruction of the duodenum, with significant distension of the first and second segment of the duodenum.  The mass is better characterized on coronal and sagittal images.  No definite retroperitoneal lymphadenopathy is seen; no suspicious peripancreatic or periportal nodes are identified.  The kidneys are unremarkable in appearance.  There is no evidence of hydronephrosis.  No renal or ureteral stones are seen.  No perinephric stranding is appreciated.  The stomach is filled with contrast and is grossly unremarkable in appearance.  No acute vascular abnormalities are seen.  There is a tiny 4 mm node adjacent to the distal abdominal aorta, nonspecific in appearance.  Within the pelvis, note is made of a complex multilobulated mass superior to the vaginal cuff.  This difficult to distinguish fully from the adjacent vaginal cuff, but measures approximately 4.4 x 4.4 x 3.9 cm.  A small amount of associated fluid is seen.  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  Given the irregular heterogeneous appearance of the vaginal cuff, infiltration into the vaginal cuff cannot entirely excluded.  The appendix is normal in caliber, without evidence for appendicitis.  The colon is grossly unremarkable in appearance, though the suspected drop metastases within the pelvis abut the distal sigmoid colon, and may be partially adherent to it.  There appears to be a 1.6 cm omental metastasis noted along the midline below the umbilicus.  No additional omental metastases are seen.  The bladder is mildly distended and grossly unremarkable in appearance.  Mild soft tissue inflammation is noted in the prevesical space, possibly reactive secondary to the lesions described above. The ovaries are relatively superior in position and appear grossly unremarkable; no suspicious adnexal masses are identified.  No inguinal lymphadenopathy is seen.  No acute osseous abnormalities are identified.  Review  of the MIP images confirms the above findings.  IMPRESSION:  1.  Poorly characterized 4.8 x 4.1 x 3.2 cm mass noted along the third and fourth segments of the duodenum, with vague additional soft tissue density extending proximally along the duodenum.  This results in partial obstruction of the duodenum, with significant distension of the first and second segments.  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 2.  Complex multilobulated mass superior to the vaginal cuff; altogether, this measures approximately 4.4 x 4.4 x 3.9 cm.  Small amount of associated fluid seen.  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  Infiltration of the underlying vaginal cuff cannot be excluded. 3.  1.6 cm omental metastasis suspected along the midline below the umbilicus; no additional omental  metastases seen. 4.  No significant retroperitoneal lymphadenopathy identified. 5.  2.1 cm stone lodged along the proximal aspect of the gallbladder; gallbladder otherwise unremarkable in appearance.  No evidence for obstruction or cholecystitis. 6.  Mild soft tissue inflammation in the prevesical space may be reactive secondary to the lesions described in the pelvis.  These results were called by telephone on 11/07/2011  at  02:08 a.m. to  Dr. Deanna Artis, who verbally acknowledged these results.  Original Report Authenticated By: Tonia Ghent, M.D.    ASSESMENT: 1.  Duodenal mass on 2/28 EGD.  Causing obstruction, retained food noted in stomach.  ? Is this the primary or a mets.  CT scan with multiple metastatic foci in pelvis, possibly lungs. On IV Protonix. 2.  GOO 3.  GB stone.  LFTs normal, no biliary ductal dilatation. 4.  Chronic constipation.  5.  Hypokalemia, resolved 6.  UTI?:  Cultures > 100,000 but showing mixed species, Trich vaginitis (few clue cells on wet prep).  On IV Flagyl, Rocephin.  PLAN: 1.  Needs oncologic and surgical involvement. Hospitalist to call consults.  Discussed all known findings thus far with pt, her mom and other family.   2.  Await surgical pathology.  CEA pending.  3.  Would d/c telemetry   LOS: 2 days   Jennye Moccasin  11/08/2011, 8:28 AM Pager: [phone]

## 2011-11-08 NOTE — Consult Note (Signed)
[facility]) - ATTENDING: Patient seen and examined.  Discussed with Dr. Pierce Crane who is also seeing patient this evening.  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  Cholelithiasis, likely symptomatic.  Pelvic disease suspicious for drop metastasis.  Discussed potential for palliative bypass procedure with gastrojejunostomy, cholecystectomy, and feeding jejunostomy tube placement.  Will discuss further with patient, family, and my partners.  If patient to proceed to surgery, would likely go to OR early to mid next week.  Velora Heckler, MD, [facility] Office: [phone]

## 2011-11-08 NOTE — Progress Notes (Signed)
Agree with Ms. Gribbin's assessment and plan. Deakin Lacek E. Marchelle Rinella, MD, FACG  

## 2011-11-09 LAB — CBC
HCT: 30.2 % — ABNORMAL LOW (ref 36.0–46.0)
Hemoglobin: 10.9 g/dL — ABNORMAL LOW (ref 12.0–15.0)
MCH: 29.6 pg (ref 26.0–34.0)
MCHC: 36.1 g/dL — ABNORMAL HIGH (ref 30.0–36.0)
MCV: 82.1 fL (ref 78.0–100.0)
RBC: 3.68 MIL/uL — ABNORMAL LOW (ref 3.87–5.11)

## 2011-11-09 LAB — BASIC METABOLIC PANEL
BUN: 6 mg/dL (ref 6–23)
CO2: 23 mEq/L (ref 19–32)
Calcium: 8.5 mg/dL (ref 8.4–10.5)
GFR calc non Af Amer: 86 mL/min — ABNORMAL LOW (ref >90)
Glucose, Bld: 92 mg/dL (ref 70–99)
Sodium: 139 mEq/L (ref 135–145)

## 2011-11-09 NOTE — Progress Notes (Signed)
Patient ID: MENDE BISWELL, female   DOB: [birth year], 47 y.o.   MRN: [id number]  General Surgery - [facility] - Progress Note  Subjective: Patient without complaints overnight.  No pain this AM.  No nausea or emesis on clear liquid diet.  Objective: Vital signs in last 24 hours: Temp:  [98.5 F (36.9 C)] 98.5 F (36.9 C) (03/02 0418) Pulse Rate:  [84-93] 84  (03/02 0418) Resp:  [16-18] 18  (03/02 0418) BP: (97-130)/(68-84) 97/68 mmHg (03/02 0418) SpO2:  [97 %-100 %] 97 % (03/02 0418) Last BM Date:  (LBM was 3 weeks ago per pt)  Intake/Output from previous day: 03/01 0701 - 03/02 0700 In: -  Out: 400 [Urine:400]  Exam: HEENT - clear, not icteric Neck - soft, without mass Chest - clear bilaterally Cor - RRR, no murmur Abd - soft without distension; no tenderness Ext - no significant edema Neuro - grossly intact, no focal deficits  Lab Results:   Basename 11/09/11 0600 11/08/11 0645  WBC 6.4 14.0*  HGB 10.9* 11.6*  HCT 30.2* 33.4*  PLT 255 306     Basename 11/09/11 0600 11/08/11 0645  NA 139 135  K 3.8 4.1  CL 110 103  CO2 23 25  GLUCOSE 92 93  BUN 6 11  CREATININE 0.81 0.90  CALCIUM 8.5 9.3    Studies/Results: No results found.  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 Almond Lint - re: feasibility of aggressive debulking procedure? Discussed plans with family and patient at bedside this AM  Velora Heckler, MD, [facility] Office: [phone]  11/09/2011

## 2011-11-09 NOTE — Progress Notes (Signed)
Subjective: Feels well no complaints  Objective: Vital signs in last 24 hours: Filed Vitals:   11/08/11 0120 11/08/11 0441 11/08/11 2022 11/09/11 0418  BP: 107/74 102/72 130/84 97/68  Pulse: 96 88 93 84  Temp: 99 F (37.2 C) 98.6 F (37 C)  98.5 F (36.9 C)  TempSrc: Oral Oral  Oral  Resp: 16 16 16 18   Height:      Weight:      SpO2: 97% 98% 100% 97%    Intake/Output Summary (Last 24 hours) at 11/09/11 1219 Last data filed at 11/09/11 0900  Gross per 24 hour  Intake    240 ml  Output    400 ml  Net   -160 ml    Weight change:   HEENT - clear, not icteric  Neck - soft, without mass  Chest - clear bilaterally  Cor - RRR, no murmur  Abd - soft without distension; no tenderness  Ext - no significant edema  Neuro - grossly intact, no focal deficits   Lab Results: Results for orders placed during the hospital encounter of 11/06/11 (from the past 24 hour(s))  CEA     Status: Normal   Collection Time   11/08/11  3:02 PM      Component Value Range   CEA 1.1  0.0 - 5.0 (ng/mL)  CBC     Status: Abnormal   Collection Time   11/09/11  6:00 AM      Component Value Range   WBC 6.4  4.0 - 10.5 (K/uL)   RBC 3.68 (*) 3.87 - 5.11 (MIL/uL)   Hemoglobin 10.9 (*) 12.0 - 15.0 (g/dL)   HCT 16.1 (*) 09.6 - 46.0 (%)   MCV 82.1  78.0 - 100.0 (fL)   MCH 29.6  26.0 - 34.0 (pg)   MCHC 36.1 (*) 30.0 - 36.0 (g/dL)   RDW 04.5  40.9 - 81.1 (%)   Platelets 255  150 - 400 (K/uL)  BASIC METABOLIC PANEL     Status: Abnormal   Collection Time   11/09/11  6:00 AM      Component Value Range   Sodium 139  135 - 145 (mEq/L)   Potassium 3.8  3.5 - 5.1 (mEq/L)   Chloride 110  96 - 112 (mEq/L)   CO2 23  19 - 32 (mEq/L)   Glucose, Bld 92  70 - 99 (mg/dL)   BUN 6  6 - 23 (mg/dL)   Creatinine, Ser 9.14  0.50 - 1.10 (mg/dL)   Calcium 8.5  8.4 - 78.2 (mg/dL)   GFR calc non Af Amer 86 (*) >90 (mL/min)   GFR calc Af Amer >90  >90 (mL/min)     Micro: Recent Results (from the past 240 hour(s))    URINE CULTURE     Status: Normal   Collection Time   10/31/11 11:46 AM      Component Value Range Status Comment   Culture ESCHERICHIA COLI   Final    Colony Count >=100,000 COLONIES/ML   Final    Organism ID, Bacteria ESCHERICHIA COLI   Final   URINE CULTURE     Status: Normal   Collection Time   11/06/11 10:05 PM      Component Value Range Status Comment   Specimen Description URINE, CLEAN CATCH   Final    Special Requests NONE   Final    Culture  Setup Time [id number]   Final    Colony Count >=100,000 COLONIES/ML   Final  Culture     Final    Value: Multiple bacterial morphotypes present, none predominant. Suggest appropriate recollection if clinically indicated.   Report Status 11/08/2011 FINAL   Final   WET PREP, GENITAL     Status: Abnormal   Collection Time   11/06/11 11:12 PM      Component Value Range Status Comment   Yeast Wet Prep HPF POC NONE SEEN  NONE SEEN  Final    Trich, Wet Prep FEW (*) NONE SEEN  Final    Clue Cells Wet Prep HPF POC FEW (*) NONE SEEN  Final    WBC, Wet Prep HPF POC FEW (*) NONE SEEN  Final     Studies/Results: No results found.  Medications:  Scheduled Meds:   . cefTRIAXone (ROCEPHIN) IVPB 1 gram/50 mL D5W  1 g Intravenous Q24H  . feeding supplement  1 Container Oral q12n4p  . metronidazole  500 mg Intravenous Q8H  . pantoprazole (PROTONIX) IV  40 mg Intravenous QHS  . sodium chloride  3 mL Intravenous Q12H   Continuous Infusions:   . dextrose 5 % and 0.9 % NaCl with KCl 20 mEq/L 100 mL (11/09/11 0550)   PRN Meds:.acetaminophen, acetaminophen, albuterol, LORazepam, morphine, ondansetron (ZOFRAN) IV, ondansetron, zolpidem   Assessment:   *Metastatic cancer  - Gi consult obtained, will follow up on recommendations  - GI oncology called ([phone], Quenton Fetter, GI oncology Dr Donnie Coffin will be following with the pt once biopsy results received shows adenocarcinoma ) [facility] CONSULTED  PET CT scan tomorrow Active Problems:   Cholelithiasis  - pt is asymptomatic  Hypokalemia  - will continue to supplement  Partial small bowel obstruction  - continue providing supportive care  Hyponatremia  - resolving  Dehydration  - continue IVF  UTI (lower urinary tract infection)  - continue ABX  Trichomonal vaginitis  - new finding dicussed with pt  EDUCATION  - test results and diagnostic studies were discussed with patient and pt's family who was present at the bedside (mother and cousin)  - patient and family have verbalized the understanding  - questions were answered at the bedside and contact information was provided for additional questions or concerns    disposition follow the recommendations are for PET scan   :    LOS: 3 days   [facility] 11/09/2011, 12:19 PM

## 2011-11-10 LAB — BASIC METABOLIC PANEL
CO2: 22 mEq/L (ref 19–32)
Calcium: 8.7 mg/dL (ref 8.4–10.5)
Creatinine, Ser: 0.69 mg/dL (ref 0.50–1.10)
GFR calc non Af Amer: >90 mL/min (ref >90)
Glucose, Bld: 94 mg/dL (ref 70–99)
Sodium: 139 mEq/L (ref 135–145)

## 2011-11-10 LAB — CBC
MCH: 29 pg (ref 26.0–34.0)
MCHC: 35.9 g/dL (ref 30.0–36.0)
MCV: 81 fL (ref 78.0–100.0)
Platelets: 286 10*3/uL (ref 150–400)
RBC: 3.89 MIL/uL (ref 3.87–5.11)

## 2011-11-10 MED ORDER — PANTOPRAZOLE SODIUM 40 MG IV SOLR
40.0000 mg | Freq: Two times a day (BID) | INTRAVENOUS | Status: DC
  Administered 2011-11-10 – 2011-11-12 (×5): 40 mg via INTRAVENOUS
  Filled 2011-11-10 (×9): qty 40

## 2011-11-10 MED ORDER — ENOXAPARIN SODIUM 30 MG/0.3ML ~~LOC~~ SOLN
30.0000 mg | SUBCUTANEOUS | Status: DC
  Administered 2011-11-10 – 2011-11-13 (×4): 30 mg via SUBCUTANEOUS
  Filled 2011-11-10 (×4): qty 0.3

## 2011-11-10 NOTE — Progress Notes (Signed)
Patient ID: Kelly Baldwin, female   DOB: 05/18/1966, 46 y.o.   MRN: [id number]  General Surgery - [facility] - Progress Note  Subjective: Patient awake and without complaints.  Family at bedside.  PET scan this AM - cancelled.  Plan to obtain tomorrow at [facility].  Objective: Vital signs in last 24 hours: Temp:  [98.3 F (36.8 C)-98.7 F (37.1 C)] 98.6 F (37 C) (03/03 0524) Pulse Rate:  [73-81] 73  (03/03 0524) Resp:  [18] 18  (03/03 0524) BP: (97-109)/(63-75) 100/69 mmHg (03/03 0524) SpO2:  [99 %-100 %] 100 % (03/03 0524) Last BM Date: 11/09/11  Intake/Output from previous day: 03/02 0701 - 03/03 0700 In: 477 [P.O.:477] Out: 200 [Urine:200]  Exam: HEENT - clear, not icteric Neck - soft Chest - clear bilaterally Cor - RRR, no murmur Abd - soft without distension; BS present, loose BM's; no tenderness Ext - no significant edema Neuro - grossly intact, no focal deficits  Lab Results:   Basename 11/10/11 0500 11/09/11 0600  WBC 6.6 6.4  HGB 11.3* 10.9*  HCT 31.5* 30.2*  PLT 286 255     Basename 11/10/11 0500 11/09/11 0600  NA 139 139  K 3.8 3.8  CL 110 110  CO2 22 23  GLUCOSE 94 92  BUN 4* 6  CREATININE 0.69 0.81  CALCIUM 8.7 8.5    Studies/Results: No results found.  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 Almond Lint and Dr. Frederik Schmidt Discussed plans with family and patient at bedside this AM  Velora Heckler, MD, [facility] Office: [phone]  11/10/2011

## 2011-11-10 NOTE — Progress Notes (Signed)
Subjective: Patient awake and without complaints. Family at bedside. PET scan this AM - cancelled. Plan to obtain tomorrow at [facility]  Objective: Vital signs in last 24 hours: Filed Vitals:   11/09/11 0418 11/09/11 1500 11/09/11 2021 11/10/11 0524  BP: 97/68 97/63 109/75 100/69  Pulse: 84 80 81 73  Temp: 98.5 F (36.9 C) 98.3 F (36.8 C) 98.7 F (37.1 C) 98.6 F (37 C)  TempSrc: Oral Oral Oral Oral  Resp: 18 18 18 18   Height:      Weight:      SpO2: 97% 99% 100% 100%    Intake/Output Summary (Last 24 hours) at 11/10/11 0857 Last data filed at 11/09/11 1500  Gross per 24 hour  Intake    477 ml  Output    200 ml  Net    277 ml    Weight change:   HEENT - clear, not icteric  Neck - soft  Chest - clear bilaterally  Cor - RRR, no murmur  Abd - soft without distension; BS present, loose BM's; no tenderness  Ext - no significant edema  Neuro - grossly intact, no focal deficits      Lab Results: Results for orders placed during the hospital encounter of 11/06/11 (from the past 24 hour(s))  CBC     Status: Abnormal   Collection Time   11/10/11  5:00 AM      Component Value Range   WBC 6.6  4.0 - 10.5 (K/uL)   RBC 3.89  3.87 - 5.11 (MIL/uL)   Hemoglobin 11.3 (*) 12.0 - 15.0 (g/dL)   HCT 16.1 (*) 09.6 - 46.0 (%)   MCV 81.0  78.0 - 100.0 (fL)   MCH 29.0  26.0 - 34.0 (pg)   MCHC 35.9  30.0 - 36.0 (g/dL)   RDW 04.5  40.9 - 81.1 (%)   Platelets 286  150 - 400 (K/uL)  BASIC METABOLIC PANEL     Status: Abnormal   Collection Time   11/10/11  5:00 AM      Component Value Range   Sodium 139  135 - 145 (mEq/L)   Potassium 3.8  3.5 - 5.1 (mEq/L)   Chloride 110  96 - 112 (mEq/L)   CO2 22  19 - 32 (mEq/L)   Glucose, Bld 94  70 - 99 (mg/dL)   BUN 4 (*) 6 - 23 (mg/dL)   Creatinine, Ser 9.14  0.50 - 1.10 (mg/dL)   Calcium 8.7  8.4 - 78.2 (mg/dL)   GFR calc non Af Amer >90  >90 (mL/min)   GFR calc Af Amer >90  >90 (mL/min)     Micro: Recent Results (from the past 240 hour(s))    URINE CULTURE     Status: Normal   Collection Time   10/31/11 11:46 AM      Component Value Range Status Comment   Culture ESCHERICHIA COLI   Final    Colony Count >=100,000 COLONIES/ML   Final    Organism ID, Bacteria ESCHERICHIA COLI   Final   URINE CULTURE     Status: Normal   Collection Time   11/06/11 10:05 PM      Component Value Range Status Comment   Specimen Description URINE, CLEAN CATCH   Final    Special Requests NONE   Final    Culture  Setup Time [id number]   Final    Colony Count >=100,000 COLONIES/ML   Final    Culture     Final  Value: Multiple bacterial morphotypes present, none predominant. Suggest appropriate recollection if clinically indicated.   Report Status 11/08/2011 FINAL   Final   WET PREP, GENITAL     Status: Abnormal   Collection Time   11/06/11 11:12 PM      Component Value Range Status Comment   Yeast Wet Prep HPF POC NONE SEEN  NONE SEEN  Final    Trich, Wet Prep FEW (*) NONE SEEN  Final    Clue Cells Wet Prep HPF POC FEW (*) NONE SEEN  Final    WBC, Wet Prep HPF POC FEW (*) NONE SEEN  Final     Studies/Results: No results found.  Medications:  Scheduled Meds:   . cefTRIAXone (ROCEPHIN) IVPB 1 gram/50 mL D5W  1 g Intravenous Q24H  . feeding supplement  1 Container Oral q12n4p  . pantoprazole (PROTONIX) IV  40 mg Intravenous QHS  . sodium chloride  3 mL Intravenous Q12H  . DISCONTD: metronidazole  500 mg Intravenous Q8H   Continuous Infusions:   . 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   - Gi consult obtained, will follow up on recommendations  - GI oncology called ([phone], Quenton Fetter, GI oncology Dr Donnie Coffin will be following with the pt once biopsy results received shows adenocarcinoma ) [facility]  CONSULTED  PET CT scan tomorrow   Active Problems:  Cholelithiasis  - pt is asymptomatic  Hypokalemia  - will continue to supplement  Partial small bowel obstruction  - continue providing supportive care  Hyponatremia  - resolving  Dehydration  - continue IVF  UTI (lower urinary tract infection)  - continue ABX  Trichomonal vaginitis  - new finding dicussed with pt  EDUCATION  - test results and diagnostic studies were discussed with patient and pt's family who was present at the bedside (mother and cousin)  - patient and family have verbalized the understanding  - questions were answered at the bedside and contact information was provided for additional questions or concerns   disposition follow the recommendations are for PET scan   :        LOS: 4 days   [facility] 11/10/2011, 8:57 AM

## 2011-11-10 NOTE — Progress Notes (Signed)
11/10/2011 4:10 PM Nursing Note  Report called to [place] on 5100. Questions addressed. Pt. Transferred to 5150 per orders.  Sema Stangler, Blanchard Kelch

## 2011-11-11 LAB — CBC
MCH: 29.3 pg (ref 26.0–34.0)
MCV: 80.7 fL (ref 78.0–100.0)
Platelets: 283 10*3/uL (ref 150–400)
RDW: 14.5 % (ref 11.5–15.5)
WBC: 7.5 10*3/uL (ref 4.0–10.5)

## 2011-11-11 MED ORDER — CIPROFLOXACIN HCL 250 MG PO TABS
250.0000 mg | ORAL_TABLET | Freq: Two times a day (BID) | ORAL | Status: DC
  Administered 2011-11-11 – 2011-11-13 (×3): 250 mg via ORAL
  Filled 2011-11-11 (×6): qty 1

## 2011-11-11 NOTE — Progress Notes (Signed)
Nutrition Follow-up  Awaiting PET scan scheduled for tomorrow at [facility].  Plan of care based on results.  Diet Order:  Dysphagia 3 diet with thin liquids ordered this AM.  Resource Breeze bid.  Meds: Scheduled Meds:    . cefTRIAXone (ROCEPHIN) IVPB 1 gram/50 mL D5W  1 g Intravenous Q24H  . enoxaparin (LOVENOX) injection  30 mg Subcutaneous Q24H  . feeding supplement  1 Container Oral q12n4p  . pantoprazole (PROTONIX) IV  40 mg Intravenous Q12H  . sodium chloride  3 mL Intravenous Q12H   Continuous Infusions:  PRN Meds:.acetaminophen, acetaminophen, albuterol, LORazepam, morphine, ondansetron (ZOFRAN) IV, ondansetron, zolpidem  Labs:  CMP     Component Value Date/Time   NA 139 11/10/2011 0500   K 3.8 11/10/2011 0500   CL 110 11/10/2011 0500   CO2 22 11/10/2011 0500   GLUCOSE 94 11/10/2011 0500   BUN 4* 11/10/2011 0500   CREATININE 0.69 11/10/2011 0500   CALCIUM 8.7 11/10/2011 0500   PROT 7.8 11/07/2011 0700   ALBUMIN 3.7 11/07/2011 0700   AST 16 11/07/2011 0700   ALT 16 11/07/2011 0700   ALKPHOS 104 11/07/2011 0700   BILITOT 1.1 11/07/2011 0700   GFRNONAA >90 11/10/2011 0500   GFRAA >90 11/10/2011 0500    Intake/Outputs: 03/03 0701 - 03/04 0700 In: 840 [P.O.:840] Out: - unmeasured output x2  Weight Status:  No new measurements available  Re-estimated needs (unchanged):  1750-2000 kcal/day, 85-100 gm protein/day  Nutrition Dx:  Inadequate oral intake--progressing  Goal:  Patient will start po diet or alternate means of nutrition in 3-5 days--met. New Goal:  Patient to meet >/=90% minimum est needs with po intake and added supplements.  Intervention:  Continue diet and supplements as ordered  Monitor:  Adequacy of intakes, plan of care   Otto Herb Pager #:  [phone]

## 2011-11-11 NOTE — Progress Notes (Signed)
Patient ID: MAZELLE HUEBERT, female   DOB: [birth year], 46 y.o.   MRN: [id number] 4 Days Post-Op  Subjective: Pt in the shower.  Spoke to mom and aunt.  Mom says she is going to try soft diet today.    Objective: Vital signs in last 24 hours: Temp:  [98.1 F (36.7 C)-98.6 F (37 C)] 98.1 F (36.7 C) (03/04 0620) Pulse Rate:  [79-91] 91  (03/04 0620) Resp:  [18] 18  (03/04 0620) BP: (105-133)/(64-79) 111/64 mmHg (03/04 0620) SpO2:  [100 %] 100 % (03/04 0620) Last BM Date: 11/10/11  Intake/Output from previous day: 03/03 0701 - 03/04 0700 In: 840 [P.O.:840] Out: -  Intake/Output this shift:    PE: Abd: deferred, pt in shower  Lab Results:   Basename 11/11/11 0702 11/10/11 0500  WBC 7.5 6.6  HGB 10.6* 11.3*  HCT 29.2* 31.5*  PLT 283 286   BMET  Basename 11/10/11 0500 11/09/11 0600  NA 139 139  K 3.8 3.8  CL 110 110  CO2 22 23  GLUCOSE 94 92  BUN 4* 6  CREATININE 0.69 0.81  CALCIUM 8.7 8.5   PT/INR No results found for this basename: LABPROT:2,INR:2 in the last 72 hours   Studies/Results: No results found.  Anti-infectives: Anti-infectives     Start     Dose/Rate Route Frequency Ordered Stop   11/07/11 0600   metroNIDAZOLE (FLAGYL) IVPB 500 mg  Status:  Discontinued        500 mg 100 mL/hr over 60 Minutes Intravenous Every 8 hours 11/07/11 0529 11/09/11 1221   11/07/11 0600   cefTRIAXone (ROCEPHIN) 1 g in dextrose 5 % 50 mL IVPB        1 g 100 mL/hr over 30 Minutes Intravenous Every 24 hours 11/07/11 0529             Assessment/Plan  1.  Duodenal mass, adenoCA 2. 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 3. PSBO  Plan: 1. Await PET scan tomorrow. 2. GYN-ONC has been called per medicine's note for their opinion of the pelvic mass 3. Will follow along for possible surgical intervention.   LOS: 5 days    Leyana Whidden E 11/11/2011

## 2011-11-11 NOTE — Progress Notes (Signed)
Subjective:  says she is going to try soft diet today   Objective: Vital signs in last 24 hours: Filed Vitals:   11/10/11 1500 11/10/11 1650 11/10/11 2205 11/11/11 0620  BP: 105/70 114/74 133/79 111/64  Pulse: 85 79 79 91  Temp: 98.6 F (37 C) 98.2 F (36.8 C) 98.4 F (36.9 C) 98.1 F (36.7 C)  TempSrc: Oral Oral Oral   Resp: 18 18 18 18   Height:      Weight:      SpO2: 100% 100% 100% 100%    Intake/Output Summary (Last 24 hours) at 11/11/11 1432 Last data filed at 11/10/11 1900  Gross per 24 hour  Intake    360 ml  Output      0 ml  Net    360 ml    Weight change:   HEENT - clear, not icteric  Neck - soft  Chest - clear bilaterally  Cor - RRR, no murmur  Abd - soft without distension; BS present, loose BM's; no tenderness  Ext - no significant edema  Neuro - grossly intact, no focal deficits       Lab Results: Results for orders placed during the hospital encounter of 11/06/11 (from the past 24 hour(s))  CBC     Status: Abnormal   Collection Time   11/11/11  7:02 AM      Component Value Range   WBC 7.5  4.0 - 10.5 (K/uL)   RBC 3.62 (*) 3.87 - 5.11 (MIL/uL)   Hemoglobin 10.6 (*) 12.0 - 15.0 (g/dL)   HCT 16.1 (*) 09.6 - 46.0 (%)   MCV 80.7  78.0 - 100.0 (fL)   MCH 29.3  26.0 - 34.0 (pg)   MCHC 36.3 (*) 30.0 - 36.0 (g/dL)   RDW 04.5  40.9 - 81.1 (%)   Platelets 283  150 - 400 (K/uL)     Micro: Recent Results (from the past 240 hour(s))  URINE CULTURE     Status: Normal   Collection Time   11/06/11 10:05 PM      Component Value Range Status Comment   Specimen Description URINE, CLEAN CATCH   Final    Special Requests NONE   Final    Culture  Setup Time [id number]   Final    Colony Count >=100,000 COLONIES/ML   Final    Culture     Final    Value: Multiple bacterial morphotypes present, none predominant. Suggest appropriate recollection if clinically indicated.   Report Status 11/08/2011 FINAL   Final   WET PREP, GENITAL     Status: Abnormal   Collection Time   11/06/11 11:12 PM      Component Value Range Status Comment   Yeast Wet Prep HPF POC NONE SEEN  NONE SEEN  Final    Trich, Wet Prep FEW (*) NONE SEEN  Final    Clue Cells Wet Prep HPF POC FEW (*) NONE SEEN  Final    WBC, Wet Prep HPF POC FEW (*) NONE SEEN  Final     Studies/Results: No results found.  Medications:  Scheduled Meds:   . cefTRIAXone (ROCEPHIN) IVPB 1 gram/50 mL D5W  1 g Intravenous Q24H  . enoxaparin (LOVENOX) injection  30 mg Subcutaneous Q24H  . feeding supplement  1 Container Oral q12n4p  . pantoprazole (PROTONIX) IV  40 mg Intravenous Q12H  . 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 Almond Lint and Dr. Frederik Schmidt  Discussed plans with family and patient at bedside this AM  PET CT scan tomorrow    Active Problems:  Cholelithiasis  - pt is asymptomatic  Hypokalemia  - will continue to supplement  Partial small bowel obstruction  - continue providing supportive care  Hyponatremia  - resolving  Dehydration  - continue IVF  UTI (lower urinary tract infection)  - continue ABX DC Rocephin changed to ciprofloxacin Trichomonal vaginitis  - new finding dicussed with pt status post treatment with Flagyl EDUCATION  - test results and diagnostic studies were discussed with patient and pt's family who was present at the bedside (mother and cousin)  - patient and family  have verbalized the understanding  - questions were answered at the bedside and contact information was provided for additional questions or concerns   disposition follow the recommendations are for PET scan        LOS: 5 days   [facility] 11/11/2011, 2:32 PM

## 2011-11-11 NOTE — Progress Notes (Signed)
Debulking operation may need to be done outside of this institution. Will awit the results of the PET scan, and I will need to review all this patient's studies prior to scheduling anything.  Will also discuss with Dr. Donell Beers.  Marta Lamas. Gae Bon, MD, FACS [phone] [facility]

## 2011-11-11 NOTE — Progress Notes (Signed)
PET scan scheduled for Tuesday, November 12, 2011 at 1:30pm. Transportation arranged with [facility]. Mother and Aunt aware.

## 2011-11-12 ENCOUNTER — Encounter: Payer: BC Managed Care – PPO | Admitting: Gastroenterology

## 2011-11-12 ENCOUNTER — Inpatient Hospital Stay (HOSPITAL_COMMUNITY)
Admit: 2011-11-12 | Discharge: 2011-11-12 | Disposition: A | Discharge status: Home / Self Care | Payer: BC Managed Care – PPO | Attending: Internal Medicine | Admitting: Internal Medicine

## 2011-11-12 LAB — CBC
MCHC: 36.7 g/dL — ABNORMAL HIGH (ref 30.0–36.0)
RDW: 14.6 % (ref 11.5–15.5)
WBC: 7.2 10*3/uL (ref 4.0–10.5)

## 2011-11-12 LAB — GLUCOSE, CAPILLARY: Glucose-Capillary: 85 mg/dL (ref 70–99)

## 2011-11-12 MED ORDER — FLUDEOXYGLUCOSE F - 18 (FDG) INJECTION
16.7000 | Freq: Once | INTRAVENOUS | Status: AC | PRN
  Administered 2011-11-12: 16.7 via INTRAVENOUS

## 2011-11-12 NOTE — Progress Notes (Signed)
   CARE MANAGEMENT NOTE 11/12/2011  Patient:  [facility]   Account Number:  [id number]  Date Initiated:  11/12/2011  Documentation initiated by:  Letha Cape  Subjective/Objective Assessment:   dx metastatic ca  admit- lives with spouse     Action/Plan:   Anticipated DC Date:  11/15/2011   Anticipated DC Plan:  HOME/SELF CARE      DC Planning Services  CM consult      Choice offered to / List presented to:             Status of service:  In process, will continue to follow Medicare Important Message given?   (If response is "NO", the following Medicare IM given date fields will be blank) Date Medicare IM given:   Date Additional Medicare IM given:    Discharge Disposition:    Per UR Regulation:    Comments:  PCP Berniece Andreas  11/12/11 15:01 Letha Cape RN, BSN [phone] Patient lives with spouse.  Patient for PET scan today at [facility], GI oncology consulted.  Will await PET scan results to determine disposition.

## 2011-11-12 NOTE — Progress Notes (Signed)
Subjective: Pt feels well today. No pain. Tolerating small bites of soft diet without any N/V currently. Scheduled for PET CT today at 1:30.   Objective: Vital signs in last 24 hours: Filed Vitals:   11/11/11 0620 11/11/11 1444 11/11/11 2234 11/12/11 0518  BP: 111/64 105/64 117/71 110/65  Pulse: 91 88 78 90  Temp: 98.1 F (36.7 C) 98 F (36.7 C) 98.7 F (37.1 C) 99 F (37.2 C)  TempSrc:  Oral Oral Oral  Resp: 18 19 18 18   Height:      Weight:      SpO2: 100% 99% 98% 99%   No intake or output data in the 24 hours ending 11/12/11 1345  Weight change:   HEENT - clear, not icteric  Neck - soft  Chest - clear bilaterally  Cor - RRR, no murmur  Abd - soft without distension; BS present, loose BM's; no tenderness  Ext - no significant edema  Neuro - grossly intact, no focal deficits       Lab Results: Results for orders placed during the hospital encounter of 11/06/11 (from the past 24 hour(s))  CBC     Status: Abnormal   Collection Time   11/12/11  5:11 AM      Component Value Range   WBC 7.2  4.0 - 10.5 (K/uL)   RBC 3.67 (*) 3.87 - 5.11 (MIL/uL)   Hemoglobin 10.8 (*) 12.0 - 15.0 (g/dL)   HCT 45.4 (*) 09.8 - 46.0 (%)   MCV 80.1  78.0 - 100.0 (fL)   MCH 29.4  26.0 - 34.0 (pg)   MCHC 36.7 (*) 30.0 - 36.0 (g/dL)   RDW 11.9  14.7 - 82.9 (%)   Platelets 276  150 - 400 (K/uL)     Micro: Recent Results (from the past 240 hour(s))  URINE CULTURE     Status: Normal   Collection Time   11/06/11 10:05 PM      Component Value Range Status Comment   Specimen Description URINE, CLEAN CATCH   Final    Special Requests NONE   Final    Culture  Setup Time [id number]   Final    Colony Count >=100,000 COLONIES/ML   Final    Culture     Final    Value: Multiple bacterial morphotypes present, none predominant. Suggest appropriate recollection if clinically indicated.   Report Status 11/08/2011 FINAL   Final   WET PREP, GENITAL     Status: Abnormal   Collection Time   11/06/11  11:12 PM      Component Value Range Status Comment   Yeast Wet Prep HPF POC NONE SEEN  NONE SEEN  Final    Trich, Wet Prep FEW (*) NONE SEEN  Final    Clue Cells Wet Prep HPF POC FEW (*) NONE SEEN  Final    WBC, Wet Prep HPF POC FEW (*) NONE SEEN  Final     Studies/Results: No results found.  Medications:  Scheduled Meds:   . ciprofloxacin  250 mg Oral BID  . enoxaparin (LOVENOX) injection  30 mg Subcutaneous Q24H  . feeding supplement  1 Container Oral q12n4p  . pantoprazole (PROTONIX) IV  40 mg Intravenous Q12H  . sodium chloride  3 mL Intravenous Q12H  . DISCONTD: cefTRIAXone (ROCEPHIN) IVPB 1 gram/50 mL D5W  1 g Intravenous Q24H   Continuous Infusions:  PRN Meds:.acetaminophen, acetaminophen, albuterol, LORazepam, morphine, ondansetron (ZOFRAN) IV, ondansetron, zolpidem   Assessment:   Metastatic cancer/ Adenocarcinoma of  duodenum with partial obstruction  Likely drop metastasis to pelvis  - Gi consult obtained, will follow up on recommendations  - GI oncology called ( GI oncology Dr Donnie Coffin will be following  [facility] CONSULTED This mass is likely unresectable, which means that a large debulking procedure may not be indicated. We will await the PET scan prior to determining this and discussing with the patient and her family.  Await PET scan on Monday 3/5  Await GYN-ONC consultation  Dr Gerrit Friends discussed with Dr. Almond Lint and Dr. Frederik Schmidt  Discussed plans with family and patient at bedside this AM     Active Problems:  Cholelithiasis  - pt is asymptomatic  Hypokalemia  - will continue to supplement  Partial small bowel obstruction  - continue providing supportive care  Hyponatremia  - resolving  Dehydration  - continue IVF  UTI (lower urinary tract infection)  - continue ABX DC Rocephin changed to ciprofloxacin  Trichomonal vaginitis  - new finding dicussed with pt status post treatment with Flagyl  EDUCATION  - test results and diagnostic studies were  discussed with patient and pt's family who was present at the bedside (mother and cousin)  - patient and family have verbalized the understanding  - questions were answered at the bedside and contact information was provided for additional questions or concerns   disposition follow the recommendations are for PET scan , may need to go to a tertiary care center         LOS: 6 days   [facility] 11/12/2011, 1:45 PM

## 2011-11-12 NOTE — Progress Notes (Signed)
Utilization review completed.  

## 2011-11-12 NOTE — Progress Notes (Signed)
Patient ID: Kelly Baldwin, female   DOB: 1966-02-01, 46 y.o.   MRN: [id number] 5 Days Post-Op  Subjective: Pt feels well today.  No pain.  Tolerating small bites of soft diet without any N/V currently.  Scheduled for PET CT today at 1:30.  Objective: Vital signs in last 24 hours: Temp:  [98 F (36.7 C)-99 F (37.2 C)] 99 F (37.2 C) (03/05 0518) Pulse Rate:  [78-90] 90  (03/05 0518) Resp:  [18-19] 18  (03/05 0518) BP: (105-117)/(64-71) 110/65 mmHg (03/05 0518) SpO2:  [98 %-99 %] 99 % (03/05 0518) Last BM Date: 11/11/11  Intake/Output from previous day: 03/04 0701 - 03/05 0700 In: 50 [IV Piggyback:50] Out: -  Intake/Output this shift:    PE: Abd: soft, NT, ND, +BS  Lab Results:   [facility] 11/12/11 0511 11/11/11 0702  WBC 7.2 7.5  HGB 10.8* 10.6*  HCT 29.4* 29.2*  PLT 276 283   BMET  Basename 11/10/11 0500  NA 139  K 3.8  CL 110  CO2 22  GLUCOSE 94  BUN 4*  CREATININE 0.69  CALCIUM 8.7   PT/INR No results found for this basename: LABPROT:2,INR:2 in the last 72 hours   Studies/Results: No results found.  Anti-infectives: Anti-infectives     Start     Dose/Rate Route Frequency Ordered Stop   11/11/11 2000   ciprofloxacin (CIPRO) tablet 250 mg        250 mg Oral 2 times daily 11/11/11 1439     11/07/11 0600   metroNIDAZOLE (FLAGYL) IVPB 500 mg  Status:  Discontinued        500 mg 100 mL/hr over 60 Minutes Intravenous Every 8 hours 11/07/11 0529 11/09/11 1221   11/07/11 0600   cefTRIAXone (ROCEPHIN) 1 g in dextrose 5 % 50 mL IVPB  Status:  Discontinued        1 g 100 mL/hr over 30 Minutes Intravenous Every 24 hours 11/07/11 0529 11/11/11 1439           Assessment/Plan  1. Duodenal adenocarcinoma with metastatic disease.  Plan: 1. Will await PET CT scan results; however, after further evaluation of the CT scan it appears the duodenal mass is intertwined with multiple major arteries in the abdomen.  This mass is likely unresectable, which means  that a large debulking procedure may not be indicated.  We will await the PET scan prior to determining this and discussing with the patient and her family.  If this turns out to be the case, given her young age, it would be appropriate for her to look at getting a second opinion by another surgeon at a tertiary care facility to make sure no one there was willing or able to perform some type of operation besides a palliative bypass.  2. Continue current care for now.  Will follow closely.   LOS: 6 days    Jafar Poffenberger E 11/12/2011

## 2011-11-12 NOTE — Progress Notes (Signed)
Agree with above.  The family expressed a desire to be involved in the decision about where to go for tertiary care if this should be appropriate.  Marta Lamas. Gae Bon, MD, FACS [phone] [facility]

## 2011-11-12 NOTE — Progress Notes (Signed)
Patient back from PET scan at [facility], transported via [facility]. No complaints at this time beside being hungry. MD notified, awaiting new orders. RN will continue to monitor.

## 2011-11-13 ENCOUNTER — Encounter (HOSPITAL_COMMUNITY): Payer: Self-pay | Admitting: *Deleted

## 2011-11-13 LAB — CBC
Platelets: 282 10*3/uL (ref 150–400)
RBC: 3.73 MIL/uL — ABNORMAL LOW (ref 3.87–5.11)
RDW: 14.7 % (ref 11.5–15.5)
WBC: 7.6 10*3/uL (ref 4.0–10.5)

## 2011-11-13 MED ORDER — PROMETHAZINE HCL 25 MG PO TABS: 25.0000 mg | ORAL_TABLET | Freq: Four times a day (QID) | ORAL | Status: AC | PRN

## 2011-11-13 MED ORDER — BOOST / RESOURCE BREEZE PO LIQD: 1.0000 | Freq: Two times a day (BID) | ORAL | Status: DC

## 2011-11-13 MED ORDER — PANTOPRAZOLE SODIUM 40 MG PO TBEC
40.0000 mg | DELAYED_RELEASE_TABLET | Freq: Two times a day (BID) | ORAL | Status: DC
  Administered 2011-11-13: 40 mg via ORAL
  Filled 2011-11-13: qty 1

## 2011-11-13 NOTE — Progress Notes (Signed)
Discharge home. Home discharge instruction given, no question verbalized. Alert and oriented, not in any distress, ambulatory. 

## 2011-11-13 NOTE — Progress Notes (Signed)
I have had conversations with Dr. Donnie Coffin, Dr. Donell Beers, and Dr. Joaquin Bend concerning the ongoing care of this patient.  After reviewing her CT scan with the radiologist, Dr. Donell Beers has determined that she would not attempt resection of this tumor at this institution because it appears to be abutting the [facility] and the [facility] which could lead to vascular injury or resection.  Outside referral was recommended.  I discussed this option with Dr. Donnie Coffin who is in agreement with an outside referral.    I have spoken in detail with Dr. Evette Cristal who will be willing to see this patient in consultation next Tuesday at 8:30 AM.  We will arrange for all her information to be sent to the clinic prior to her visit, but [facility] should have access to our PACS.  We will get copies of her pathology report and xray reports to the patient ASAP.  Will be able to discharged today, maintaining her on soft diet until her clinic appointment.  Dr. Luan Moore clinic number is [phone].  We have had contact with their office.  Marta Lamas. Gae Bon, MD, FACS [phone] [facility]

## 2011-11-13 NOTE — Discharge Summary (Signed)
HOSPITAL DISCHARGE SUMMARY    @n   Kelly Baldwin, 46 y.o., DOB 28-Jul-1966, MRN [id number]  Admission date: 11/06/2011 Discharge Date: 11/13/2011  Primary MD Lorretta Harp, MD, MD  Admitting Physician Therisa Doyne, MD  Admission Diagnosis  Chest pain [786.5] Nausea and vomiting [787.0] Abdominal pain [789.0] Cancer [199.1] Dyspnea [786.09] Abd pain Duodenal mass duodenal mass  Discharge Diagnoses:    Active Hospital Problems  Diagnoses Date Noted   . 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 Cholelithiasis 11/07/2011   . Hypokalemia 11/07/2011   . Partial small bowel obstruction 11/07/2011   . Hyponatremia 11/07/2011   . Dehydration 11/07/2011   . UTI (lower urinary tract infection) - treated 11/07/2011   . Trichomonal vaginitis - treated 11/07/2011   . Duodenal obstruction 11/07/2011     Resolved Hospital Problems  Diagnoses Date Noted Date Resolved    Past Medical History  Diagnosis Date  . GERD (gastroesophageal reflux disease)   . Onychomycosis of toenail   . Leg pain   . History of recurrent UTIs   . Hx of hysterectomy for benign disease     partial  . Shortness of breath     Past Surgical History  Procedure Date  . Tubal ligation   . Abdominal hysterectomy 2009    bleeding non cancer partial fibrods  . Ovarian cyst surgery   . Enteroscopy 11/07/2011    Procedure: ENTEROSCOPY;  Surgeon: Iva Boop, MD;  Location: [facility] ENDOSCOPY;  Service: Endoscopy;  Laterality: N/A;    Consults  General Surgery and Oncology Significant Tests:  See full reports for all details    Hospital Course See H&P, Labs, Consult and Test reports for all details. In brief, this patient was admitted for symptoms relating to acute  Intestinal duodenal obstruction.  She was having abdominal pain and Presented with RLQ pain and 3-4 weeks of Nausea and vomiting. Initialy thought her gall bladder was involved. US done on 26 showed cholelithiasis but no cholecystitis. 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 Patient is being admitted for further evaluation and management.  Of note she has had frequent UTI and have recently finished SEPTRA. Her wet prep was mildly positive for Trichomonas which was treated.  Pt had an EGD with biopsy (please see full path report).  Pt was restarted on soft diet and tolerated very well prior to discharge.  Pt will be seen by surgery at [facility] (see below).  Discharged in stable condition.   Pt was seen by Dr. Lindie Spruce who reported that he had conversations with Dr. Donnie Coffin, Dr. Donell Beers, and Dr. Joaquin Bend concerning the ongoing care of this patient. After reviewing her CT scan with the radiologist, Dr. Donell Beers has determined that she would not attempt resection of this tumor at this institution because it appears to be abutting the SM[facility] and the SMA which could lead to vascular injury or resection. Outside referral was recommended. I discussed this option with Dr. Donnie Coffin who is in agreement with an outside referral.  I have spoken in detail with Dr. Evette Cristal who will be willing to see this patient in consultation next Tuesday at 8:30 AM. We will arrange for all her information to be sent to the clinic prior to her visit, but [facility] should have access to our PACS. We will get copies of her pathology  report and xray reports to the patient ASAP. Will be able to discharged today, maintaining her on soft diet until her clinic appointment.  Dr. Luan Moore clinic number is [phone]. We have had contact with their office.  Please see op reports and pathology reports in EMR.   Active Hospital Problems  Diagnoses Date Noted   . 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 Cholelithiasis 11/07/2011   . Hypokalemia 11/07/2011   . Partial small bowel obstruction 11/07/2011   . Hyponatremia 11/07/2011    . Dehydration 11/07/2011   . UTI (lower urinary tract infection) 11/07/2011   . Trichomonal vaginitis 11/07/2011   . Duodenal obstruction 11/07/2011     Resolved Hospital Problems  Diagnoses Date Noted Date Resolved     Today's Assessment:   Subjective:   Kelly Baldwin  Is eating and tolerating a soft diet very well with no nausea or vomiting.  She has spoken with the surgery team and is in agreement with going home with plan to see Dr. Evette Cristal at [facility] on Tuesday as scheduled.    Objective:   Blood pressure 103/58, pulse 86, temperature 98.8 F (37.1 C), temperature source Oral, resp. rate 16, height 5\' 6"  (1.676 m), weight 85.276 kg (188 lb), SpO2 100.00%.  Intake/Output Summary (Last 24 hours) at 11/13/11 1302 Last data filed at 11/13/11 0800  Gross per 24 hour  Intake    360 ml  Output      0 ml  Net    360 ml    Exam Gen: awake, no distress, cooperative HEENT - clear, not icteric  Neck - soft  Chest - clear bilaterally  Cor - RRR, no murmur  Abd - soft without distension; BS present, loose BM's; no tenderness  Ext - no significant edema  Neuro - grossly intact, no focal deficits    Cultures - reviewed   Lab Results  Component Value Date   WBC 7.6 11/13/2011   HGB 10.8* 11/13/2011   HCT 29.7* 11/13/2011   PLT 282 11/13/2011   LYMPHOPCT 23 11/06/2011   MONOPCT 6 11/06/2011   EOSPCT 2 11/06/2011   BASOPCT 0 11/06/2011   CMP: Lab Results  Component Value Date   NA 139 11/10/2011   K 3.8 11/10/2011   CL 110 11/10/2011   CO2 22 11/10/2011   BUN 4* 11/10/2011   CREATININE 0.69 11/10/2011   PROT 7.8 11/07/2011   ALBUMIN 3.7 11/07/2011   BILITOT 1.1 11/07/2011   ALKPHOS 104 11/07/2011   AST 16 11/07/2011   ALT 16 11/07/2011  .  Discharge Instructions     Soft Diet See Dr. Evette Cristal as scheduled Return if symptoms worsen or new changes develop.    DISCHARGE MEDICATION: Medication List  As of 11/13/2011  1:02 PM   STOP taking these medications          sulfamethoxazole-trimethoprim 800-160 MG per tablet         TAKE these medications         feeding supplement Liqd   Take 1 Container by mouth 2 times daily at 12 noon and 4 pm.      potassium chloride SA 20 MEQ tablet   Commonly known as: K-DUR,KLOR-CON   Take 20 mEq by mouth daily.      promethazine 25 MG tablet   Commonly known as: PHENERGAN   Take 25 mg by mouth every 6 (six) hours as needed. For nausea      triamterene-hydrochlorothiazide 37.5-25 MG per capsule   Commonly  known as: DYAZIDE   Take 1 capsule by mouth daily.            Disposition and Follow-up: Discharge Orders    Future Appointments: Provider: Department: Dept Phone: Center:   11/20/2011 1:50 PM Wilmon Arms. Tsuei, MD [facility] [phone] None     Future Orders Please Complete By Expires   Increase activity slowly      Discharge instructions      Comments:   Please resume soft diet at home Please go to your appointment with Dr. Joaquin Bend on Tuesday at 0830 AM as scheduled.  Return if symptoms recur, worsen or new changes develop.     Call MD for:  persistant nausea and vomiting      Call MD for:  severe uncontrolled pain      Call MD for:  difficulty breathing, headache or visual disturbances      Call MD for:  extreme fatigue        Follow-up Information    Follow up with Lorretta Harp, MD. Call in 2 days. (call for appointment with your doctor for re-evaluation. )    Contact information:   [address] [place] [address] [phone]       Follow up with Deveron Furlong, MD on 11/19/2011. (at 8:30 AM; be sure to bring CD with images; phone number [phone])    Contact information:   [facility] [address] [place] [address] [phone]         The risks, benefits, and possible side effects of all treatments and tests were explained to the patient.  The patient verbalized  understanding.  The importance of close follow up with the primary care medical provider was explained clearly to the patient.  The patient verbalized understanding.  The patient was given instructions to return if symptoms recur, worsen or new changes develop.  The patient verbalized understanding.   Cleora Fleet, MD, CDE, FAAFP [facility] [place], [place]  [phone]  Total Time spent reviewing critical document, reviewing this patient's comprehensive hospitalization, arranging follow up and coordination of care, reviewing data and todays exam greater than 35 minutes.   Signed: Beverly Ferner 11/13/2011 1:02 PM

## 2011-11-13 NOTE — Progress Notes (Signed)
Patient ID: Kelly Baldwin, female   DOB: 12/30/1965, 46 y.o.   MRN: [id number] 6 Days Post-Op  Subjective: Pt feels well today, no pain.  Is happy to be eating solid foods, tolerating well without nausea or vomiting.  PET scan done yesterday.   Objective: Vital signs in last 24 hours: Temp:  [98.3 F (36.8 C)-98.8 F (37.1 C)] 98.8 F (37.1 C) (03/06 0532) Pulse Rate:  [80-86] 86  (03/06 0532) Resp:  [16-18] 16  (03/06 0532) BP: (103-125)/(58-83) 103/58 mmHg (03/06 0532) SpO2:  [100 %] 100 % (03/06 0532) Last BM Date: 11/11/11  Intake/Output from previous day: 03/05 0701 - 03/06 0700 In: 240 [P.O.:240] Out: -    PE: Gen: NAD Abd: soft, NT, ND, +BS  Lab Results:   Basename 11/13/11 0550 11/12/11 0511  WBC 7.6 7.2  HGB 10.8* 10.8*  HCT 29.7* 29.4*  PLT 282 276   BMET No results found for this basename: NA:2,K:2,CL:2,CO2:2,GLUCOSE:2,BUN:2,CREATININE:2,CALCIUM:2 in the last 72 hours PT/INR No results found for this basename: LABPROT:2,INR:2 in the last 72 hours   Studies/Results: Nm Pet Image Initial (pi) Skull Base To Thigh  11/12/2011  *RADIOLOGY REPORT*  Clinical Data: Initial treatment strategy for adenocarcinoma of the duodenum.  NUCLEAR MEDICINE PET CT SKULL BASE TO THIGH  Technique:  16.7 mCi F-18 FDG was injected intravenously via the right forearm.  Full-ring PET imaging was performed from the skull base through the mid-thighs 65  minutes after injection.  CT data was obtained and used for attenuation correction and anatomic localization only.  (This was not acquired as a diagnostic CT examination.)  Fasting Blood Glucose:  85  Patient Weight:  188 pounds.  Comparison: CTs of 11/07/2011.  No prior PET.  Findings: PET images demonstrate no abnormal activity within the neck and chest.  The upper abdominal PET data is mildly misregistered superior to the CT data.  Hypermetabolic mass at the distal duodenum/duodenal jejunal junction.  Is difficult to quantify at CT due to  underdistension, but measures in the vicinity of 2.8 cm and a S.U.V. max of 16.0 on image 139.  The questionable umbilical nodule on prior CT is again identified. This measures 1.0 cm and a S.U.V. max of 2.5 on image 180.  Complex eccentric right cul-de-sac hypermetabolic mass.  This measures 4.8 x 3.3 cm and a S.U.V. max of 8.7 on image 208.  CT images performed for attenuation correction demonstrate no significant findings within the neck.  Chest, abdomen, and pelvic findings deferred to recent diagnostic CTs.  No evidence of acute superimposed process within the chest. Calcified gallstones.  The partial bowel obstruction has resolved. Fat containing right inguinal hernia.  IMPRESSION: 1.  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 2.  Hypermetabolic cul-de-sac mass is suspicious for peritoneal metastasis. 3.  Subtle periumbilical nodule with mild low level hypermetabolism.  This is nonspecific.  Periumbilical nodal metastasis cannot be excluded.  However, infectious or inflammatory etiologies could look similar. 4.  The tiny nodules described on prior chest CT are below the resolution of PET. 5.  Resolved small bowel obstruction.  Original Report Authenticated By: Consuello Bossier, M.D.    Anti-infectives: Anti-infectives     Start     Dose/Rate Route Frequency Ordered Stop   11/11/11 2000   ciprofloxacin (CIPRO) tablet 250 mg        250 mg Oral 2 times daily 11/11/11 1439     11/07/11 0600   metroNIDAZOLE (FLAGYL) IVPB 500 mg  Status:  Discontinued  500 mg 100 mL/hr over 60 Minutes Intravenous Every 8 hours 11/07/11 0529 11/09/11 1221   11/07/11 0600   cefTRIAXone (ROCEPHIN) 1 g in dextrose 5 % 50 mL IVPB  Status:  Discontinued        1 g 100 mL/hr over 30 Minutes Intravenous Every 24 hours 11/07/11 0529 11/11/11 1439           Assessment/Plan  1. Duodenal adenocarcinoma with metastatic disease.  Plan: 1. PET scan showing no further disease involvement  than visualized on CT.  Dr. Lindie Spruce will be by later today to discuss surgical options with patient.    2. Continue current care for now.  Will follow closely.    LOS: 7 days    Hever Castilleja 11/13/2011, 8:37 AM

## 2011-11-13 NOTE — ED Provider Notes (Signed)
Evaluation and management procedures were performed by the resident physician under my supervision/collaboration.    Lynniah Janoski D Jrue Yambao, MD 11/13/11 2023 

## 2011-11-13 NOTE — Discharge Instructions (Signed)
You have an appointment with Dr. Deveron Furlong on Tuesday 3/12 at 8:30am in [facility] at [facility].  Be sure to bring the CD with radiology images.  They will send you a letter with directions to their clinic in the mail this week.    Please return to the hospital if you are unable to keep down fluids and become dehydrated, or if you have nausea and vomiting that does not resolve.

## 2011-11-15 ENCOUNTER — Telehealth: Payer: Self-pay | Admitting: Internal Medicine

## 2011-11-15 ENCOUNTER — Other Ambulatory Visit: Payer: BC Managed Care – PPO

## 2011-11-15 NOTE — Telephone Encounter (Signed)
Needs short term disability paperwork filled out but needed to know if you guys should fill it out or the hospital.

## 2011-11-15 NOTE — Telephone Encounter (Signed)
Ask pt who she is seeing for surgeon and oncology. She is seeing someone at [facility] on Tues. She is going to have them send [place] notes and have them fill out the form.

## 2011-11-20 ENCOUNTER — Ambulatory Visit (INDEPENDENT_AMBULATORY_CARE_PROVIDER_SITE_OTHER): Payer: BC Managed Care – PPO | Admitting: Surgery

## 2012-01-20 ENCOUNTER — Other Ambulatory Visit: Payer: Self-pay | Admitting: Internal Medicine

## 2012-01-25 ENCOUNTER — Other Ambulatory Visit: Payer: Self-pay | Admitting: Internal Medicine

## 2012-02-17 ENCOUNTER — Telehealth: Payer: Self-pay | Admitting: Internal Medicine

## 2012-02-17 NOTE — Telephone Encounter (Signed)
Pt daughter called stating that her mother is having a hard  Time swallowing her potassium pill and is requesting to have the script changed to a capsul. Please contact

## 2012-02-17 NOTE — Telephone Encounter (Signed)
Per Dr. Fabian Sharp called pt to update pt's medication list and to see when pt had her bloodwork drawn last. Left a message for return call.  Ok per Dr. Fabian Sharp to give pt potassium capsules.

## 2012-02-19 ENCOUNTER — Emergency Department (HOSPITAL_COMMUNITY)
Admission: EM | Admit: 2012-02-19 | Discharge: 2012-02-20 | Payer: BC Managed Care – PPO | Attending: Emergency Medicine | Admitting: Emergency Medicine

## 2012-02-19 ENCOUNTER — Emergency Department (HOSPITAL_COMMUNITY): Payer: BC Managed Care – PPO

## 2012-02-19 DIAGNOSIS — K219 Gastro-esophageal reflux disease without esophagitis: Secondary | ICD-10-CM | POA: Insufficient documentation

## 2012-02-19 DIAGNOSIS — Z79899 Other long term (current) drug therapy: Secondary | ICD-10-CM | POA: Insufficient documentation

## 2012-02-19 DIAGNOSIS — R5381 Other malaise: Secondary | ICD-10-CM | POA: Insufficient documentation

## 2012-02-19 DIAGNOSIS — E871 Hypo-osmolality and hyponatremia: Secondary | ICD-10-CM | POA: Insufficient documentation

## 2012-02-19 DIAGNOSIS — C170 Malignant neoplasm of duodenum: Secondary | ICD-10-CM | POA: Insufficient documentation

## 2012-02-19 DIAGNOSIS — R55 Syncope and collapse: Secondary | ICD-10-CM

## 2012-02-19 DIAGNOSIS — E860 Dehydration: Secondary | ICD-10-CM

## 2012-02-19 DIAGNOSIS — R109 Unspecified abdominal pain: Secondary | ICD-10-CM | POA: Insufficient documentation

## 2012-02-19 LAB — POCT I-STAT, CHEM 8
Creatinine, Ser: 1.9 mg/dL — ABNORMAL HIGH (ref 0.50–1.10)
Glucose, Bld: 127 mg/dL — ABNORMAL HIGH (ref 70–99)
Hemoglobin: 11.6 g/dL — ABNORMAL LOW (ref 12.0–15.0)
Sodium: 120 mEq/L — ABNORMAL LOW (ref 135–145)
TCO2: 18 mmol/L (ref 0–100)

## 2012-02-19 LAB — CBC
HCT: 28 % — ABNORMAL LOW (ref 36.0–46.0)
Hemoglobin: 9.9 g/dL — ABNORMAL LOW (ref 12.0–15.0)
MCV: 80.7 fL (ref 78.0–100.0)
Platelets: 389 10*3/uL (ref 150–400)
RBC: 3.47 MIL/uL — ABNORMAL LOW (ref 3.87–5.11)
WBC: 11.1 10*3/uL — ABNORMAL HIGH (ref 4.0–10.5)

## 2012-02-19 MED ORDER — SODIUM CHLORIDE 0.9 % IV BOLUS (SEPSIS)
1000.0000 mL | Freq: Once | INTRAVENOUS | Status: AC
  Administered 2012-02-19: 1000 mL via INTRAVENOUS

## 2012-02-19 NOTE — ED Notes (Signed)
ZOX:[id number]<AV> Expected date:02/19/12<BR> Expected time:<BR> Means of arrival:<BR> Comments:<BR> EMS [facility] - cancer pt/syncope

## 2012-02-19 NOTE — ED Notes (Signed)
Brought in by EMS with c/o "feeling hot" and having syncopal episode at home. Per EMS, pt had used bathroom and had a bowel movement and then passed out. Presents to ED A/Ox4, denies dizziness or nausea or headache, no neurological deficits noted. Denies pain. Pt has hx of duodenal cancer.

## 2012-02-20 ENCOUNTER — Encounter (HOSPITAL_COMMUNITY): Payer: Self-pay | Admitting: Emergency Medicine

## 2012-02-20 LAB — COMPREHENSIVE METABOLIC PANEL
AST: 28 U/L (ref 0–37)
CO2: 17 mEq/L — ABNORMAL LOW (ref 19–32)
Chloride: 80 mEq/L — ABNORMAL LOW (ref 96–112)
Creatinine, Ser: 1.72 mg/dL — ABNORMAL HIGH (ref 0.50–1.10)
GFR calc Af Amer: 40 mL/min — ABNORMAL LOW (ref >90)
GFR calc non Af Amer: 35 mL/min — ABNORMAL LOW (ref >90)
Glucose, Bld: 125 mg/dL — ABNORMAL HIGH (ref 70–99)
Total Bilirubin: 0.8 mg/dL (ref 0.3–1.2)

## 2012-02-20 LAB — URINALYSIS, ROUTINE W REFLEX MICROSCOPIC
Hgb urine dipstick: NEGATIVE
Protein, ur: NEGATIVE mg/dL
Urobilinogen, UA: 0.2 mg/dL (ref 0.0–1.0)

## 2012-02-20 MED ORDER — SODIUM CHLORIDE 0.9 % IV SOLN
INTRAVENOUS | Status: DC
  Administered 2012-02-20: 02:00:00 via INTRAVENOUS

## 2012-02-20 MED FILL — Morphine Sulfate Inj 10 MG/ML: INTRAMUSCULAR | Qty: 1 | Status: AC

## 2012-02-20 NOTE — ED Provider Notes (Addendum)
History     CSN: [id number]  Arrival date & time 02/19/12  2302   First MD Initiated Contact with Patient 02/19/12 2309      Chief Complaint  Patient presents with  . Loss of Consciousness    (Consider location/radiation/quality/duration/timing/severity/associated sxs/prior treatment) HPI History provided by patient and her family members at bedside. Has metastatic cancer and does not eat or drink much normally. She has been feeling more weakness and fatigue over the last few days and today after using the bathroom, walking back to her bed and passed out. No chest pain or shortness of breath. She was feeling lightheaded and near syncopal. She denies any injury. She has persistent ongoing unchanged abdominal discomfort.  she has a gallbladder drain in place. No fevers or chills. No headache or neck stiffness. Past Medical History  Diagnosis Date  . GERD (gastroesophageal reflux disease)   . Onychomycosis of toenail   . Leg pain   . History of recurrent UTIs   . Hx of hysterectomy for benign disease     partial  . Shortness of breath     Past Surgical History  Procedure Date  . Tubal ligation   . Abdominal hysterectomy 2009    bleeding non cancer partial fibrods  . Ovarian cyst surgery   . Enteroscopy 11/07/2011    Procedure: ENTEROSCOPY;  Surgeon: Iva Boop, MD;  Location: [facility] ENDOSCOPY;  Service: Endoscopy;  Laterality: N/A;    Family History  Problem Relation Age of Onset  . Diabetes Mother     4  . Hypertension Mother   . Glaucoma Mother   . Diabetes Father     45  . Hyperlipidemia Father   . Hypertension Father   . Coronary artery disease Father   . Colon cancer Father   . Colon cancer Paternal Grandfather     GF paternal father has polyps    History  Substance Use Topics  . Smoking status: Former Smoker -- 1 years  . Smokeless tobacco: Never Used  . Alcohol Use: 1.8 oz/week    3 Cans of beer per week     occasional    OB History    Grav Para  Term Preterm Abortions TAB SAB Ect Mult Living                  Review of Systems  Constitutional: Negative for fever and chills.  HENT: Negative for neck pain and neck stiffness.   Eyes: Negative for pain.  Respiratory: Negative for shortness of breath.   Cardiovascular: Negative for chest pain.  Gastrointestinal: Positive for abdominal pain.  Genitourinary: Negative for dysuria.  Musculoskeletal: Negative for back pain.  Skin: Negative for rash.  Neurological: Positive for syncope and weakness. Negative for headaches.  All other systems reviewed and are negative.    Allergies  Review of patient's allergies indicates no known allergies.  Home Medications   Current Outpatient Rx  Name Route Sig Dispense Refill  . DRONABINOL 5 MG PO CAPS Oral Take 5 mg by mouth 2 (two) times daily before a meal.    . RESOURCE BREEZE PO LIQD Oral Take 1 Container by mouth 2 times daily at 12 noon and 4 pm.    . MEGESTROL ACETATE 40 MG/ML PO SUSP Oral Take 200 mg by mouth 2 (two) times daily before a meal.    . ONDANSETRON HCL 4 MG PO TABS Oral Take 4 mg by mouth every 8 (eight) hours as needed. Nausea    .  CALCIUM POLYCARBOPHIL 625 MG PO TABS Oral Take 625 mg by mouth 2 (two) times daily.    Marland Kitchen POTASSIUM CHLORIDE CRYS ER 20 MEQ PO TBCR Oral Take 40 mEq by mouth daily.     Marland Kitchen PROMETHAZINE HCL 25 MG PO TABS Oral Take 1 tablet (25 mg total) by mouth every 6 (six) hours as needed. For nausea 30 tablet 0  . TRIAMTERENE-HCTZ 37.5-25 MG PO CAPS  TAKE ONE CAPSULE BY MOUTH EVERY DAY 90 capsule 0    BP 137/96  Pulse 121  Resp 19  SpO2 100%  Physical Exam  Constitutional: She is oriented to person, place, and time. She appears well-developed and well-nourished.  HENT:  Head: Normocephalic and atraumatic.       Very dry mucous membranes  Eyes: Conjunctivae and EOM are normal. Pupils are equal, round, and reactive to light.  Neck: Trachea normal. Neck supple. No thyromegaly present.  Cardiovascular:  Regular rhythm, S1 normal, S2 normal and normal pulses.     No systolic murmur is present   No diastolic murmur is present  Pulses:      Radial pulses are 2+ on the right side, and 2+ on the left side.       Tachycardic  Pulmonary/Chest: Effort normal and breath sounds normal. She has no wheezes. She has no rhonchi. She has no rales. She exhibits no tenderness.  Abdominal: Soft. Normal appearance and bowel sounds are normal. There is no CVA tenderness and negative Murphy's sign.       Mild diffuse tenderness with bowel sounds present. Gallbladder drain in place appears to be actively draining  Musculoskeletal:       BLE:s Calves nontender, no cords or erythema, negative Homans sign  Neurological: She is alert and oriented to person, place, and time. She has normal strength. No cranial nerve deficit or sensory deficit. GCS eye subscore is 4. GCS verbal subscore is 5. GCS motor subscore is 6.  Skin: Skin is warm and dry. No rash noted. She is not diaphoretic.  Psychiatric: Her speech is normal.       Cooperative and appropriate    ED Course  Procedures (including critical care time)  Labs Reviewed  CBC - Abnormal; Notable for the following:    WBC 11.1 (*)     RBC 3.47 (*)     Hemoglobin 9.9 (*)     HCT 28.0 (*)     RDW 15.9 (*)     All other components within normal limits  COMPREHENSIVE METABOLIC PANEL - Abnormal; Notable for the following:    Sodium 118 (*)     Chloride 80 (*)     CO2 17 (*)     Glucose, Bld 125 (*)     BUN 36 (*)     Creatinine, Ser 1.72 (*)     Alkaline Phosphatase 127 (*)     GFR calc non Af Amer 35 (*)     GFR calc Af Amer 40 (*)     All other components within normal limits  POCT I-STAT, CHEM 8 - Abnormal; Notable for the following:    Sodium 120 (*)     Potassium 5.2 (*)     Chloride 92 (*)     BUN 35 (*)     Creatinine, Ser 1.90 (*)     Glucose, Bld 127 (*)     Hemoglobin 11.6 (*)     HCT 34.0 (*)     All other components within normal limits  GLUCOSE, CAPILLARY - Abnormal; Notable for the following:    Glucose-Capillary 119 (*)     All other components within normal limits  POCT I-STAT TROPONIN I  URINALYSIS, ROUTINE W REFLEX MICROSCOPIC   Dg Chest Portable 1 View  02/20/2012  *RADIOLOGY REPORT*  Clinical Data: Syncope, duodenal adenocarcinoma  PORTABLE CHEST - 1 VIEW  Comparison: 11/07/2011 CT  Findings: Left chest wall Port-A-Cath with tip projecting over the proximal SVC.  Lungs are clear.  No focal consolidation, pleural effusion, or pneumothorax.  Cardiomediastinal contours within normal limits.  No acute osseous finding.  IMPRESSION: No radiographic evidence of acute cardiopulmonary process.  Original Report Authenticated By: Waneta Martins, M.D.     1. Syncope   2. Dehydration   3. Hyponatremia    IV fluids provided. Labs and imaging obtained and reviewed as above.  Medicine consult for admission. At 12:45 AM discussed with [facility] hospitalist on-call who agrees to admission.   Date: 02/20/2012  Rate: 117  Rhythm: sinus tachycardia  QRS Axis: normal  Intervals: normal  ST/T Wave abnormalities: nonspecific ST changes  Conduction Disutrbances:none  Narrative Interpretation:   Old EKG Reviewed: none available   MDM  Nursing notes reviewed. Vital signs reviewed.   Cancer patient with failure to thrive and dehydration/ hyponatremia. Syncope tonight likely secondary to dehydration. Plan medical admission.   CRITICAL CARE Performed by: Sunnie Nielsen   Total critical care time: 35  Critical care time was exclusive of separately billable procedures and treating other patients.  Critical care was necessary to treat or prevent imminent or life-threatening deterioration.  Critical care was time spent personally by me on the following activities: development of treatment plan with patient and/or surrogate as well as nursing, discussions with consultants, evaluation of patient's response to treatment, examination  of patient, obtaining history from patient or surrogate, ordering and performing treatments and interventions, ordering and review of laboratory studies, ordering and review of radiographic studies, pulse oximetry and re-evaluation of patient's condition. IVF resuscitation. Multiple consultants involved for transfer.      Sunnie Nielsen, MD 02/20/12 [phone]  hospitalist evaluated PT and she is requesting to be admitted to [facility] where she is followed by Heme Onc and has appt in am to discuss restarting chemo. Plan cont IVFs.  1:17 AM d/w fellow on call at [facility], Dr Maryagnes Amos who can not accept PT but feels PT should be transferred to [facility].  Attneding Dr Lyman Bishop paged and case discussed as above. She accepts PT in transfer.   Sunnie Nielsen, MD 02/20/12 0126  Sunnie Nielsen, MD 02/20/12 [phone]

## 2012-02-20 NOTE — Telephone Encounter (Signed)
Left a message for the pt to call back.  

## 2012-02-25 NOTE — Telephone Encounter (Signed)
Left a message on voicemail for the pt to call back. 

## 2012-02-26 NOTE — Telephone Encounter (Signed)
Spoke with daughter - pt currently in hospital in [place] . Stable at this time - feeding tube being place today . I explained that they need to call with current med list once she is dc'd or better yet make a post hospital rov so Dr. Fabian Sharp can review current meds and health status.

## 2012-04-30 ENCOUNTER — Other Ambulatory Visit: Payer: Self-pay | Admitting: Internal Medicine

## 2013-11-23 ENCOUNTER — Encounter (HOSPITAL_COMMUNITY): Payer: Self-pay | Admitting: Emergency Medicine

## 2013-11-23 ENCOUNTER — Emergency Department (HOSPITAL_COMMUNITY)
Admission: EM | Admit: 2013-11-23 | Discharge: 2013-11-24 | Disposition: A | Discharge status: Home / Self Care | Payer: BC Managed Care – PPO | Attending: Emergency Medicine | Admitting: Emergency Medicine

## 2013-11-23 DIAGNOSIS — Z87891 Personal history of nicotine dependence: Secondary | ICD-10-CM | POA: Insufficient documentation

## 2013-11-23 DIAGNOSIS — R748 Abnormal levels of other serum enzymes: Secondary | ICD-10-CM | POA: Insufficient documentation

## 2013-11-23 DIAGNOSIS — Z90710 Acquired absence of both cervix and uterus: Secondary | ICD-10-CM | POA: Insufficient documentation

## 2013-11-23 DIAGNOSIS — E871 Hypo-osmolality and hyponatremia: Secondary | ICD-10-CM | POA: Insufficient documentation

## 2013-11-23 DIAGNOSIS — Z85028 Personal history of other malignant neoplasm of stomach: Secondary | ICD-10-CM | POA: Insufficient documentation

## 2013-11-23 DIAGNOSIS — C801 Malignant (primary) neoplasm, unspecified: Secondary | ICD-10-CM | POA: Insufficient documentation

## 2013-11-23 DIAGNOSIS — Z8619 Personal history of other infectious and parasitic diseases: Secondary | ICD-10-CM | POA: Insufficient documentation

## 2013-11-23 DIAGNOSIS — E860 Dehydration: Secondary | ICD-10-CM | POA: Insufficient documentation

## 2013-11-23 DIAGNOSIS — N289 Disorder of kidney and ureter, unspecified: Secondary | ICD-10-CM | POA: Insufficient documentation

## 2013-11-23 DIAGNOSIS — Z8719 Personal history of other diseases of the digestive system: Secondary | ICD-10-CM | POA: Insufficient documentation

## 2013-11-23 DIAGNOSIS — Z87440 Personal history of urinary (tract) infections: Secondary | ICD-10-CM | POA: Insufficient documentation

## 2013-11-23 DIAGNOSIS — Z79899 Other long term (current) drug therapy: Secondary | ICD-10-CM | POA: Insufficient documentation

## 2013-11-23 DIAGNOSIS — C799 Secondary malignant neoplasm of unspecified site: Secondary | ICD-10-CM

## 2013-11-23 DIAGNOSIS — D649 Anemia, unspecified: Secondary | ICD-10-CM

## 2013-11-23 HISTORY — DX: Malignant (primary) neoplasm, unspecified: C80.1

## 2013-11-23 MED ORDER — SODIUM CHLORIDE 0.9 % IV SOLN: 1000.0000 mL | INTRAVENOUS | Status: DC

## 2013-11-23 MED ORDER — SODIUM CHLORIDE 0.9 % IV SOLN
1000.0000 mL | Freq: Once | INTRAVENOUS | Status: AC
  Administered 2013-11-24: 1000 mL via INTRAVENOUS

## 2013-11-23 MED ORDER — ONDANSETRON HCL 4 MG/2ML IJ SOLN
4.0000 mg | Freq: Once | INTRAMUSCULAR | Status: AC
  Administered 2013-11-24: 4 mg via INTRAVENOUS
  Filled 2013-11-23: qty 2

## 2013-11-23 NOTE — ED Notes (Signed)
Bed: JJ94 Expected date:  Expected time:  Means of arrival:  Comments: EMS 48yo weakness

## 2013-11-23 NOTE — ED Notes (Addendum)
Per EMS, pt has been feeling weaker and progressively more lethargic over the past 3 days. Pt has hx of stomach cancer. Pt has been with hospice care since 11/02/13. Family states pt has not eaten in a month. Pt was tachy for EMS, with a heart rate in 130s. Family states pt has hx of tachycardia.

## 2013-11-23 NOTE — ED Provider Notes (Signed)
CSN: [id number]     Arrival date & time 11/23/13  2250 History   First MD Initiated Contact with Patient 11/23/13 2332     Chief Complaint  Patient presents with  . Weakness  . Fatigue     (Consider location/radiation/quality/duration/timing/severity/associated sxs/prior Treatment) Patient is a 48 y.o. female presenting with weakness. The history is provided by the patient, a relative and the spouse.  Weakness  She has metastatic cancer and has a nephrostomy in place as well as a colostomy. She has had poor oral intake for a considerable amount of time. Today, she got up to go to the bathroom and collapsed. She had a similar episode last June which is due to dehydration and her husband is concerned that she is dehydrated. There has been some mild vomiting but no diarrhea. She denies pain currently. She's currently under hospice care for her cancer but husband states that she still wishes to be resuscitated for cardiac or pulmonary arrest.  Past Medical History  Diagnosis Date  . GERD (gastroesophageal reflux disease)   . Onychomycosis of toenail   . Leg pain   . History of recurrent UTIs   . Hx of hysterectomy for benign disease     partial  . Shortness of breath    Past Surgical History  Procedure Laterality Date  . Tubal ligation    . Abdominal hysterectomy  2009    bleeding non cancer partial fibrods  . Ovarian cyst surgery    . Enteroscopy  11/07/2011    Procedure: ENTEROSCOPY;  Surgeon: Gatha Mayer, MD;  Location: [facility];  Service: Endoscopy;  Laterality: N/A;   Family History  Problem Relation Age of Onset  . Diabetes Mother     56  . Hypertension Mother   . Glaucoma Mother   . Diabetes Father     70  . Hyperlipidemia Father   . Hypertension Father   . Coronary artery disease Father   . Colon cancer Father   . Colon cancer Paternal Grandfather     GF paternal father has polyps   History  Substance Use Topics  . Smoking status: Former Smoker -- 1 years   . Smokeless tobacco: Never Used  . Alcohol Use: No     Comment: occasional   OB History   Grav Para Term Preterm Abortions TAB SAB Ect Mult Living                 Review of Systems  Neurological: Positive for weakness.  All other systems reviewed and are negative.      Allergies  Oxycodone  Home Medications   Current Outpatient Rx  Name  Route  Sig  Dispense  Refill  . glycopyrrolate (ROBINUL) 1 MG tablet   Oral   Take 1 mg by mouth 3 (three) times daily.         Marland Kitchen LORazepam (ATIVAN) 1 MG tablet   Oral   Take 1 mg by mouth every 6 (six) hours as needed for anxiety.         Marland Kitchen morphine (MS CONTIN) 30 MG 12 hr tablet   Oral   Take 30 mg by mouth every 12 (twelve) hours.         . prochlorperazine (COMPAZINE) 10 MG tablet   Oral   Take 10 mg by mouth every 6 (six) hours as needed for nausea or vomiting.         . promethazine (PHENERGAN) 25 MG tablet   Oral  Take 1 tablet (25 mg total) by mouth every 6 (six) hours as needed. For nausea   30 tablet   0    BP 102/74  Pulse 136  Temp(Src) 97.5 F (36.4 C) (Oral)  Resp 18  SpO2 98% Physical Exam  Nursing note and vitals reviewed.  Chronically ill-appearing 48 year old female, resting comfortably and in no acute distress. Vital signs are significant for tachycardia with heart rate 136. Oxygen saturation is 98%, which is normal. Head is normocephalic and atraumatic. PERRLA, EOMI. Oropharynx is clear. Sclerae are anicteric. Eyes are sunken and mucous membranes are dry. Neck is nontender and supple without adenopathy or JVD. Back is nontender and there is no CVA tenderness. Lungs are clear without rales, wheezes, or rhonchi. Chest is nontender. Heart has regular rate and rhythm without murmur. Abdomen: A colostomy is present in the right lower quadrant. There is a dressing over a mass in the epigastric area which apparently is tumor and is the site of drainage. There is moderate tenderness in this area.  Remainder of abdomen is soft and nontender. Peristalsis is hypoactive.Left nephrostomy tube present and draining urine. Extremities have no cyanosis or edema, full range of motion is present. Skin is warm and dry without rash. Neurologic: She is lethargic but arousable and able to answer questions, cranial nerves are intact, there are no motor or sensory deficits.  ED Course  Procedures (including critical care time) Labs Review Results for orders placed during the hospital encounter of 11/23/13  CBC WITH DIFFERENTIAL      Result Value Ref Range   WBC 15.1 (*) 4.0 - 10.5 K/uL   RBC 3.51 (*) 3.87 - 5.11 MIL/uL   Hemoglobin 9.8 (*) 12.0 - 15.0 g/dL   HCT 29.9 (*) 36.0 - 46.0 %   MCV 85.2  78.0 - 100.0 fL   MCH 27.9  26.0 - 34.0 pg   MCHC 32.8  30.0 - 36.0 g/dL   RDW 18.7 (*) 11.5 - 15.5 %   Platelets 205  150 - 400 K/uL   Neutrophils Relative % 88 (*) 43 - 77 %   Neutro Abs 13.2 (*) 1.7 - 7.7 K/uL   Lymphocytes Relative 7 (*) 12 - 46 %   Lymphs Abs 1.0  0.7 - 4.0 K/uL   Monocytes Relative 5  3 - 12 %   Monocytes Absolute 0.8  0.1 - 1.0 K/uL   Eosinophils Relative 0  0 - 5 %   Eosinophils Absolute 0.0  0.0 - 0.7 K/uL   Basophils Relative 1  0 - 1 %   Basophils Absolute 0.1  0.0 - 0.1 K/uL  COMPREHENSIVE METABOLIC PANEL      Result Value Ref Range   Sodium 129 (*) 137 - 147 mEq/L   Potassium 3.2 (*) 3.7 - 5.3 mEq/L   Chloride 77 (*) 96 - 112 mEq/L   CO2 34 (*) 19 - 32 mEq/L   Glucose, Bld 143 (*) 70 - 99 mg/dL   BUN 63 (*) 6 - 23 mg/dL   Creatinine, Ser 1.47 (*) 0.50 - 1.10 mg/dL   Calcium 9.7  8.4 - 10.5 mg/dL   Total Protein 7.3  6.0 - 8.3 g/dL   Albumin 2.5 (*) 3.5 - 5.2 g/dL   AST 147 (*) 0 - 37 U/L   ALT 69 (*) 0 - 35 U/L   Alkaline Phosphatase 478 (*) 39 - 117 U/L   Total Bilirubin 2.7 (*) 0.3 - 1.2 mg/dL   GFR  calc non Af Amer 41 (*) >90 mL/min   GFR calc Af Amer 48 (*) >90 mL/min   MDM   Final diagnoses:  Dehydration  Renal insufficiency  Metastatic cancer   Anemia  Hyponatremia  Elevated liver enzymes    End-stage metastatic cancer with dehydration. Old records are reviewed and ED visit last June was for very similar presentation. At that time, creatinine was 1.7. She will be given IV hydration and IV ondansetron. Husband states that he wishes to take her home if at all possible in following appropriate hydration.  Creatinine is down to 1.4 but BUN is up to 65. Mild hyponatremia and hypokalemia are also noted. Moderate elevations of transaminases and alkaline phosphatase are noted. She was given 3 L of fluid and was resting comfortably. Heart rate was down to 105. She was felt to be stable for discharge at this point.  Delora Fuel, MD [phone]

## 2013-11-24 ENCOUNTER — Encounter (HOSPITAL_COMMUNITY): Payer: Self-pay | Admitting: Emergency Medicine

## 2013-11-24 LAB — CBC WITH DIFFERENTIAL/PLATELET
Basophils Absolute: 0.1 10*3/uL (ref 0.0–0.1)
Basophils Relative: 1 % (ref 0–1)
Eosinophils Absolute: 0 10*3/uL (ref 0.0–0.7)
Eosinophils Relative: 0 % (ref 0–5)
HEMATOCRIT: 29.9 % — AB (ref 36.0–46.0)
HEMOGLOBIN: 9.8 g/dL — AB (ref 12.0–15.0)
LYMPHS ABS: 1 10*3/uL (ref 0.7–4.0)
LYMPHS PCT: 7 % — AB (ref 12–46)
MCH: 27.9 pg (ref 26.0–34.0)
MCHC: 32.8 g/dL (ref 30.0–36.0)
MCV: 85.2 fL (ref 78.0–100.0)
MONO ABS: 0.8 10*3/uL (ref 0.1–1.0)
MONOS PCT: 5 % (ref 3–12)
NEUTROS ABS: 13.2 10*3/uL — AB (ref 1.7–7.7)
Neutrophils Relative %: 88 % — ABNORMAL HIGH (ref 43–77)
Platelets: 205 10*3/uL (ref 150–400)
RBC: 3.51 MIL/uL — AB (ref 3.87–5.11)
RDW: 18.7 % — ABNORMAL HIGH (ref 11.5–15.5)
WBC: 15.1 10*3/uL — ABNORMAL HIGH (ref 4.0–10.5)

## 2013-11-24 LAB — COMPREHENSIVE METABOLIC PANEL
ALK PHOS: 478 U/L — AB (ref 39–117)
ALT: 69 U/L — ABNORMAL HIGH (ref 0–35)
AST: 147 U/L — ABNORMAL HIGH (ref 0–37)
Albumin: 2.5 g/dL — ABNORMAL LOW (ref 3.5–5.2)
BILIRUBIN TOTAL: 2.7 mg/dL — AB (ref 0.3–1.2)
BUN: 63 mg/dL — AB (ref 6–23)
CHLORIDE: 77 meq/L — AB (ref 96–112)
CO2: 34 meq/L — AB (ref 19–32)
CREATININE: 1.47 mg/dL — AB (ref 0.50–1.10)
Calcium: 9.7 mg/dL (ref 8.4–10.5)
GFR calc Af Amer: 48 mL/min — ABNORMAL LOW (ref >90)
GFR, EST NON AFRICAN AMERICAN: 41 mL/min — AB (ref >90)
GLUCOSE: 143 mg/dL — AB (ref 70–99)
Potassium: 3.2 mEq/L — ABNORMAL LOW (ref 3.7–5.3)
Sodium: 129 mEq/L — ABNORMAL LOW (ref 137–147)
Total Protein: 7.3 g/dL (ref 6.0–8.3)

## 2013-11-24 MED ORDER — HEPARIN SOD (PORK) LOCK FLUSH 100 UNIT/ML IV SOLN
500.0000 [IU] | Freq: Once | INTRAVENOUS | Status: AC
  Administered 2013-11-24: 500 [IU]
  Filled 2013-11-24: qty 5

## 2013-11-24 MED ORDER — MORPHINE SULFATE 4 MG/ML IJ SOLN
4.0000 mg | Freq: Once | INTRAMUSCULAR | Status: AC
  Administered 2013-11-24: 4 mg via INTRAVENOUS
  Filled 2013-11-24: qty 1

## 2013-11-24 MED ORDER — SODIUM CHLORIDE 0.9 % IV BOLUS (SEPSIS)
1000.0000 mL | Freq: Once | INTRAVENOUS | Status: AC
  Administered 2013-11-24: 1000 mL via INTRAVENOUS

## 2013-11-24 NOTE — ED Notes (Addendum)
Pt's family says that pt has not been eating for about one month; pt is a poor historian, family able to answer most questions.

## 2013-11-24 NOTE — Discharge Instructions (Signed)
Dehydration, Adult Dehydration is when you lose more fluids from the body than you take in. Vital organs like the kidneys, brain, and heart cannot function without a proper amount of fluids and salt. Any loss of fluids from the body can cause dehydration.  CAUSES   Vomiting.  Diarrhea.  Excessive sweating.  Excessive urine output.  Fever. SYMPTOMS  Mild dehydration  Thirst.  Dry lips.  Slightly dry mouth. Moderate dehydration  Very dry mouth.  Sunken eyes.  Skin does not bounce back quickly when lightly pinched and released.  Dark urine and decreased urine production.  Decreased tear production.  Headache. Severe dehydration  Very dry mouth.  Extreme thirst.  Rapid, weak pulse (more than 100 beats per minute at rest).  Cold hands and feet.  Not able to sweat in spite of heat and temperature.  Rapid breathing.  Blue lips.  Confusion and lethargy.  Difficulty being awakened.  Minimal urine production.  No tears. DIAGNOSIS  Your caregiver will diagnose dehydration based on your symptoms and your exam. Blood and urine tests will help confirm the diagnosis. The diagnostic evaluation should also identify the cause of dehydration. TREATMENT  Treatment of mild or moderate dehydration can often be done at home by increasing the amount of fluids that you drink. It is best to drink small amounts of fluid more often. Drinking too much at one time can make vomiting worse. Refer to the home care instructions below. Severe dehydration needs to be treated at the hospital where you will probably be given intravenous (IV) fluids that contain water and electrolytes. HOME CARE INSTRUCTIONS   Ask your caregiver about specific rehydration instructions.  Drink enough fluids to keep your urine clear or pale yellow.  Drink small amounts frequently if you have nausea and vomiting.  Eat as you normally do.  Avoid:  Foods or drinks high in sugar.  Carbonated  drinks.  Juice.  Extremely hot or cold fluids.  Drinks with caffeine.  Fatty, greasy foods.  Alcohol.  Tobacco.  Overeating.  Gelatin desserts.  Wash your hands well to avoid spreading bacteria and viruses.  Only take over-the-counter or prescription medicines for pain, discomfort, or fever as directed by your caregiver.  Ask your caregiver if you should continue all prescribed and over-the-counter medicines.  Keep all follow-up appointments with your caregiver. SEEK MEDICAL CARE IF:  You have abdominal pain and it increases or stays in one area (localizes).  You have a rash, stiff neck, or severe headache.  You are irritable, sleepy, or difficult to awaken.  You are weak, dizzy, or extremely thirsty. SEEK IMMEDIATE MEDICAL CARE IF:   You are unable to keep fluids down or you get worse despite treatment.  You have frequent episodes of vomiting or diarrhea.  You have blood or green matter (bile) in your vomit.  You have blood in your stool or your stool looks black and tarry.  You have not urinated in 6 to 8 hours, or you have only urinated a small amount of very dark urine.  You have a fever.  You faint. MAKE SURE YOU:   Understand these instructions.  Will watch your condition.  Will get help right away if you are not doing well or get worse. Document Released: 08/26/2005 Document Revised: 11/18/2011 Document Reviewed: 04/15/2011 Exit[employer] Patient Information 2014 [employer].  

## 2013-12-19 ENCOUNTER — Emergency Department (HOSPITAL_COMMUNITY): Payer: BC Managed Care – PPO

## 2013-12-19 ENCOUNTER — Emergency Department (HOSPITAL_COMMUNITY)
Admission: EM | Admit: 2013-12-19 | Discharge: 2014-01-07 | Disposition: E | Payer: BC Managed Care – PPO | Attending: Emergency Medicine | Admitting: Emergency Medicine

## 2013-12-19 ENCOUNTER — Encounter (HOSPITAL_COMMUNITY): Payer: Self-pay | Admitting: Emergency Medicine

## 2013-12-19 DIAGNOSIS — Z87440 Personal history of urinary (tract) infections: Secondary | ICD-10-CM | POA: Insufficient documentation

## 2013-12-19 DIAGNOSIS — Z8619 Personal history of other infectious and parasitic diseases: Secondary | ICD-10-CM | POA: Insufficient documentation

## 2013-12-19 DIAGNOSIS — Z79899 Other long term (current) drug therapy: Secondary | ICD-10-CM | POA: Insufficient documentation

## 2013-12-19 DIAGNOSIS — I469 Cardiac arrest, cause unspecified: Secondary | ICD-10-CM | POA: Insufficient documentation

## 2013-12-19 DIAGNOSIS — R17 Unspecified jaundice: Secondary | ICD-10-CM | POA: Insufficient documentation

## 2013-12-19 DIAGNOSIS — C169 Malignant neoplasm of stomach, unspecified: Secondary | ICD-10-CM | POA: Insufficient documentation

## 2013-12-19 DIAGNOSIS — Z515 Encounter for palliative care: Secondary | ICD-10-CM | POA: Insufficient documentation

## 2013-12-19 DIAGNOSIS — Z87891 Personal history of nicotine dependence: Secondary | ICD-10-CM | POA: Insufficient documentation

## 2013-12-19 DIAGNOSIS — I959 Hypotension, unspecified: Secondary | ICD-10-CM | POA: Insufficient documentation

## 2013-12-19 DIAGNOSIS — C799 Secondary malignant neoplasm of unspecified site: Secondary | ICD-10-CM

## 2013-12-19 DIAGNOSIS — R000 Tachycardia, unspecified: Secondary | ICD-10-CM | POA: Insufficient documentation

## 2013-12-19 DIAGNOSIS — Z90710 Acquired absence of both cervix and uterus: Secondary | ICD-10-CM | POA: Insufficient documentation

## 2013-12-19 LAB — CBC WITH DIFFERENTIAL/PLATELET
BASOS ABS: 0 10*3/uL (ref 0.0–0.1)
Basophils Relative: 0 % (ref 0–1)
EOS PCT: 0 % (ref 0–5)
Eosinophils Absolute: 0 10*3/uL (ref 0.0–0.7)
HCT: 17.5 % — ABNORMAL LOW (ref 36.0–46.0)
Hemoglobin: 5.4 g/dL — CL (ref 12.0–15.0)
Lymphocytes Relative: 10 % — ABNORMAL LOW (ref 12–46)
Lymphs Abs: 1.7 10*3/uL (ref 0.7–4.0)
MCH: 28.6 pg (ref 26.0–34.0)
MCHC: 30.9 g/dL (ref 30.0–36.0)
MCV: 92.6 fL (ref 78.0–100.0)
MONO ABS: 0.7 10*3/uL (ref 0.1–1.0)
MONOS PCT: 4 % (ref 3–12)
NEUTROS PCT: 86 % — AB (ref 43–77)
Neutro Abs: 14.4 10*3/uL — ABNORMAL HIGH (ref 1.7–7.7)
PLATELETS: 87 10*3/uL — AB (ref 150–400)
RBC: 1.89 MIL/uL — AB (ref 3.87–5.11)
RDW: 23.2 % — AB (ref 11.5–15.5)
WBC: 16.8 10*3/uL — AB (ref 4.0–10.5)

## 2013-12-19 LAB — COMPREHENSIVE METABOLIC PANEL
ALBUMIN: 2 g/dL — AB (ref 3.5–5.2)
ALT: 337 U/L — ABNORMAL HIGH (ref 0–35)
AST: 1009 U/L — AB (ref 0–37)
Alkaline Phosphatase: 1065 U/L — ABNORMAL HIGH (ref 39–117)
BILIRUBIN TOTAL: 5.7 mg/dL — AB (ref 0.3–1.2)
BUN: 120 mg/dL — AB (ref 6–23)
CHLORIDE: 87 meq/L — AB (ref 96–112)
CO2: 12 mEq/L — ABNORMAL LOW (ref 19–32)
CREATININE: 3.47 mg/dL — AB (ref 0.50–1.10)
Calcium: 8.6 mg/dL (ref 8.4–10.5)
GFR calc Af Amer: 17 mL/min — ABNORMAL LOW (ref >90)
GFR calc non Af Amer: 15 mL/min — ABNORMAL LOW (ref >90)
Glucose, Bld: 41 mg/dL — CL (ref 70–99)
POTASSIUM: 6.3 meq/L — AB (ref 3.7–5.3)
Sodium: 143 mEq/L (ref 137–147)
TOTAL PROTEIN: 5.5 g/dL — AB (ref 6.0–8.3)

## 2013-12-19 LAB — CBG MONITORING, ED

## 2013-12-19 MED ORDER — LIDOCAINE HCL (CARDIAC) 20 MG/ML IV SOLN
INTRAVENOUS | Status: AC
  Filled 2013-12-19: qty 5

## 2013-12-19 MED ORDER — MORPHINE SULFATE 2 MG/ML IJ SOLN: 2.0000 mg | INTRAMUSCULAR | Status: DC | PRN

## 2013-12-19 MED ORDER — GLYCOPYRROLATE 0.2 MG/ML IJ SOLN: 0.1000 mg | INTRAMUSCULAR | Status: DC | PRN

## 2013-12-19 MED ORDER — ETOMIDATE 2 MG/ML IV SOLN
INTRAVENOUS | Status: AC
  Filled 2013-12-19: qty 20

## 2013-12-19 MED ORDER — SUCCINYLCHOLINE CHLORIDE 20 MG/ML IJ SOLN
INTRAMUSCULAR | Status: AC
  Filled 2013-12-19: qty 1

## 2013-12-19 MED ORDER — DEXTROSE 50 % IV SOLN: 50.0000 mL | Freq: Once | INTRAVENOUS | Status: DC

## 2013-12-19 MED ORDER — ROCURONIUM BROMIDE 50 MG/5ML IV SOLN
INTRAVENOUS | Status: AC
  Filled 2013-12-19: qty 2

## 2013-12-22 ENCOUNTER — Telehealth (HOSPITAL_BASED_OUTPATIENT_CLINIC_OR_DEPARTMENT_OTHER): Payer: Self-pay

## 2014-01-07 NOTE — Code Documentation (Signed)
RT at bedside, going to start [place], MD went to speak to family about wishes for pt's care.

## 2014-01-07 NOTE — ED Provider Notes (Signed)
Level V caveat acuity of the patient's condition, patient is unresponsive  Patient presents emergency room status post an episode of CPR. The patient has end-stage gastric cancer. Family states this was diagnosed 2 years ago.  Patient is no longer a candidate for any treatment. Unfortunately her cancer has not responded to treatments. Patient is currently on hospice care. Family states yesterday her blood pressure was very low. It was in the 59s. Hospice came out to evaluate the patient today. She was given morphine for comfort. Some time after, the family felt the patient was no longer breathing. They initiated CPR. EMS arrived and the patient was breathing spontaneously. Physical Exam  BP 47/32  Pulse 117  Resp 15  Physical Exam  Nursing note and vitals reviewed. Constitutional:  Cachectic, eyes fixated  HENT:  Head: Normocephalic and atraumatic.  Right Ear: External ear normal.  Left Ear: External ear normal.  Mouth/Throat: No oropharyngeal exudate.  Mucous membranes are dry  Eyes: Right eye exhibits no discharge. Left eye exhibits no discharge. Scleral icterus is present.  Neck: Neck supple. No tracheal deviation present.  Cardiovascular: Tachycardia present.   Pulmonary/Chest: No stridor. No respiratory distress.  Agonal respirations  Abdominal:  Abdomen is firm, ostomy noted in the right abdomen, some blood noted at the ostomy site,  Musculoskeletal: She exhibits no edema.  Decreased muscle mass  Neurological: She is unresponsive. Cranial nerve deficit: no gross deficits.  Skin: Skin is warm and dry. No rash noted.  Psychiatric: She has a normal mood and affect.    ED Course  CRITICAL CARE Performed by: Dorie Rank R Authorized by: Dorie Rank R Total critical care time: 30 minutes Critical care was necessary to treat or prevent imminent or life-threatening deterioration of the following conditions: cardiac failure, circulatory failure and shock. Critical care was time spent  personally by me on the following activities: discussions with consultants, development of treatment plan with patient or surrogate, examination of patient, ordering and review of radiographic studies and re-evaluation of patient's condition.    MDM I had discussion with the patient's family including her husband. They know that the patient's cancer  is no longer treatable. She is on hospice. Although the patient had initially wanted to be DO NOT RESUSCITATE they do understand that any additional treatment at this time is futile.  I discussed treatment with medications for pain, fluids and oxygen to help her with her breathing. Husband agrees to this plan. I explained to him that I do not think intubation or medications to try to bring her blood pressure up or additional CPR would be in her best interests.  He agrees with our current treatment.    Pt was provided comfort care in the ED.  Family members were at the bedside.  Ultimately patient died in the ED.  See Dr. Payton Emerald note for time of death.  Her physician at [facility] was contacted.  Death certificate will be sent to their office.   I saw and evaluated the patient, reviewed the resident's note and I agree with the findings and plan.   Kathalene Frames, MD 2014-01-12 [phone]

## 2014-01-07 NOTE — ED Notes (Signed)
Per EMS - pt found unresponsive hx of stage 4 cancer. Full code. Assisted ventilations with BVM.

## 2014-01-07 NOTE — Code Documentation (Signed)
Per EMS - family called out for unconscious. Upon ems arrival pt was agonal, faint pulse. Family started cpr. ems assisted ventilation with BVM, manual BP 60 systolic. IO in left tibia. Pt's RR increased en route, RR 33, HR 104-120s. CBG 112. Pt has stage 4 GI cancer. Pt is full code.

## 2014-01-07 NOTE — Code Documentation (Signed)
Spoke with husband, he doesn't want anything invasive and no pressers for her BP. Pt is a hospice pt.

## 2014-01-07 NOTE — ED Notes (Signed)
Family at bedside. 

## 2014-01-07 NOTE — ED Notes (Signed)
Etomidate and Recoronium were drawn up into vials when pt was to be intubated. meds were never given and were wasted infront on Melanie RN and [employer]. Pharmacy aware.

## 2014-01-07 NOTE — ED Provider Notes (Signed)
CSN: [id number]     Arrival date & time 18-Jan-2014  1905 History   First MD Initiated Contact with Patient 01/18/2014 1917     No chief complaint on file.    (Consider location/radiation/quality/duration/timing/severity/associated sxs/prior Treatment) The history is provided by the EMS personnel and the spouse.   history of present illness: 48 year old female with history of stage IV metastatic abdominal cancer who presented via EMS status post CPR. Patient is a hospice patient and had her first visit from hospice this morning. This afternoon the patient was found to be unresponsive by the husband, the husband could not rouse her and started CPR. On EMS arrival patient had a pulse with agonal respirations. Hypertensive with systolics in the 40J. Respirations were assisted in route via bag-valve-mask.  Past Medical History  Diagnosis Date  . GERD (gastroesophageal reflux disease)   . Onychomycosis of toenail   . Leg pain   . History of recurrent UTIs   . Hx of hysterectomy for benign disease     partial  . Shortness of breath   . Cancer    Past Surgical History  Procedure Laterality Date  . Tubal ligation    . Abdominal hysterectomy  2009    bleeding non cancer partial fibrods  . Ovarian cyst surgery    . Enteroscopy  11/07/2011    Procedure: ENTEROSCOPY;  Surgeon: Gatha Mayer, MD;  Location: [facility];  Service: Endoscopy;  Laterality: N/A;   Family History  Problem Relation Age of Onset  . Diabetes Mother     72  . Hypertension Mother   . Glaucoma Mother   . Diabetes Father     74  . Hyperlipidemia Father   . Hypertension Father   . Coronary artery disease Father   . Colon cancer Father   . Colon cancer Paternal Grandfather     GF paternal father has polyps   History  Substance Use Topics  . Smoking status: Former Smoker -- 1 years  . Smokeless tobacco: Never Used  . Alcohol Use: No     Comment: occasional   OB History   Grav Para Term Preterm Abortions TAB SAB  Ect Mult Living                 Review of Systems  Unable to perform ROS: Patient unresponsive      Allergies  Oxycodone  Home Medications   Current Outpatient Rx  Name  Route  Sig  Dispense  Refill  . glycopyrrolate (ROBINUL) 1 MG tablet   Oral   Take 1 mg by mouth 3 (three) times daily.         Marland Kitchen LORazepam (ATIVAN) 1 MG tablet   Oral   Take 1 mg by mouth every 6 (six) hours as needed for anxiety.         Marland Kitchen morphine (MS CONTIN) 30 MG 12 hr tablet   Oral   Take 30 mg by mouth every 12 (twelve) hours.         . prochlorperazine (COMPAZINE) 10 MG tablet   Oral   Take 10 mg by mouth every 6 (six) hours as needed for nausea or vomiting.         . promethazine (PHENERGAN) 25 MG tablet   Oral   Take 1 tablet (25 mg total) by mouth every 6 (six) hours as needed. For nausea   30 tablet   0    BP 67/15  Pulse 117  Resp 16  Wt  100 lb (45.36 kg) Physical Exam  Nursing note and vitals reviewed. Constitutional: She appears cachectic. She appears ill. She appears distressed.  HENT:  Head: Normocephalic and atraumatic.  Eyes: Pupils are equal, round, and reactive to light.  Neck: Trachea normal. Neck supple.  Cardiovascular: Regular rhythm.  Tachycardia present.   Pulmonary/Chest: Tachypnea noted. She is in respiratory distress.  Agonal respirations  Abdominal: There is tenderness. There is rigidity.  Colostomy bag in place  Musculoskeletal: She exhibits no edema.  Neurological: She is unresponsive. GCS eye subscore is 4. GCS verbal subscore is 1. GCS motor subscore is 1.  Skin: No rash noted.    ED Course  Procedures (including critical care time) Labs Review Labs Reviewed  CBC WITH DIFFERENTIAL - Abnormal; Notable for the following:    WBC 16.8 (*)    RBC 1.89 (*)    Hemoglobin 5.4 (*)    HCT 17.5 (*)    RDW 23.2 (*)    Platelets 87 (*)    Neutrophils Relative % 86 (*)    Lymphocytes Relative 10 (*)    Neutro Abs 14.4 (*)    All other components  within normal limits  COMPREHENSIVE METABOLIC PANEL - Abnormal; Notable for the following:    Potassium 6.3 (*)    Chloride 87 (*)    CO2 12 (*)    Glucose, Bld 41 (*)    BUN 120 (*)    Creatinine, Ser 3.47 (*)    Total Protein 5.5 (*)    Albumin 2.0 (*)    AST 1009 (*)    ALT 337 (*)    Alkaline Phosphatase 1065 (*)    Total Bilirubin 5.7 (*)    GFR calc non Af Amer 15 (*)    GFR calc Af Amer 17 (*)    All other components within normal limits  CBG MONITORING, ED - Abnormal; Notable for the following:    Glucose-Capillary <10 (*)    All other components within normal limits   Imaging Review Dg Chest Portable 1 View  01/09/2014   CLINICAL DATA:  Respiratory distress, small bowel adenocarcinoma  EXAM: PORTABLE CHEST - 1 VIEW  COMPARISON:  DG CHEST 1V PORT dated 02/19/2012; NM PET IMAGE INITIAL (PI) SKULL BASE TO THIGH dated 11/12/2011  FINDINGS: Left-sided Port-A-Cath in place with tip over the cavoatrial junction. Heart size is normal. There is ill-defined predominantly right upper lobe patchy reticular nodular opacity. No pleural effusion. No pneumothorax. No acute osseous finding. Gaseous gastric distention is partly visualized.  IMPRESSION: Patchy right upper lobe reticular nodular airspace opacity which could indicate pneumonia. Metastatic disease is possible but less likely. The patient has reportedly undergone radiation therapy, and radiation pneumonitis is possible but also felt less likely.   Electronically Signed   By: Conchita Paris M.D.   On: 2014/01/09 19:54     EKG Interpretation None      MDM   Final diagnoses:  Metastatic cancer  Cardiac arrest    49 year old female with history of stage IV metastatic abdominal cancer who presents via EMS from home after becoming unresponsive and having CPR performed by family member. On arrival patient with agonal respirations hypotensive blood pressure 47/32. Patient does not respond to verbal or painful stimuli.  Extensive  conversation had with the patient's husband regarding goals of care. Will not perform invasive procedures such as intubation, central line placement, pressors. Patient to be DO NOT RESUSCITATE in agreement with husband. We'll continue to give IV fluids and look  for treatable causes that do not require invasive procedures. And will insure patient's comfort.  Initial labs remarkable for worsening renal failure, hyperkalemia, and hypoglycemia. Results were discussed with the patient's husband and her family. At this time they requested no further interventions and would like her to be comfort care only. We'll provide when necessary pain medication, glycopyrrolate as necessary.  Called to the room by nursing for patient unresponsiveness. Patient found to have pupils fixed and dilated, no palpable carotid pulse, absent heart sounds, and no respiratory effort. Time of death Jan 01, 2137.  Patient's next of kin and family were present and notified of patient's death.  Notified Hematology/Oncology at [facility]  Renaldo Reel, MD 09-Jan-2014 [phone]

## 2014-01-07 NOTE — ED Notes (Signed)
Pt asystole, family at bedside. Dr Tomi Bamberger and Dr Modena Nunnery notified.

## 2014-01-07 NOTE — Progress Notes (Signed)
Chaplain responded to CPR in progress.  Chaplain met family and promoted information sharing between medical team and family.  Chaplain escorted family to consultation B and Trauma C so they can visit with pt.  Pt might be moved to ICU later in the night.  Chaplain ended visit due to other trauma's entering the ED.

## 2014-01-07 DEATH — deceased

## 2016-01-18 IMAGING — CR DG CHEST 1V PORT
1 series · 1 of 1 positions shown · non-contrast
Comparison: DG CHEST 1V PORT dated 02/19/2012; NM PET IMAGE INITIAL
(PI) SKULL BASE TO THIGH dated 11/12/2011

CLINICAL DATA: Respiratory distress, small bowel adenocarcinoma

EXAM:
PORTABLE CHEST - 1 VIEW

[AP]
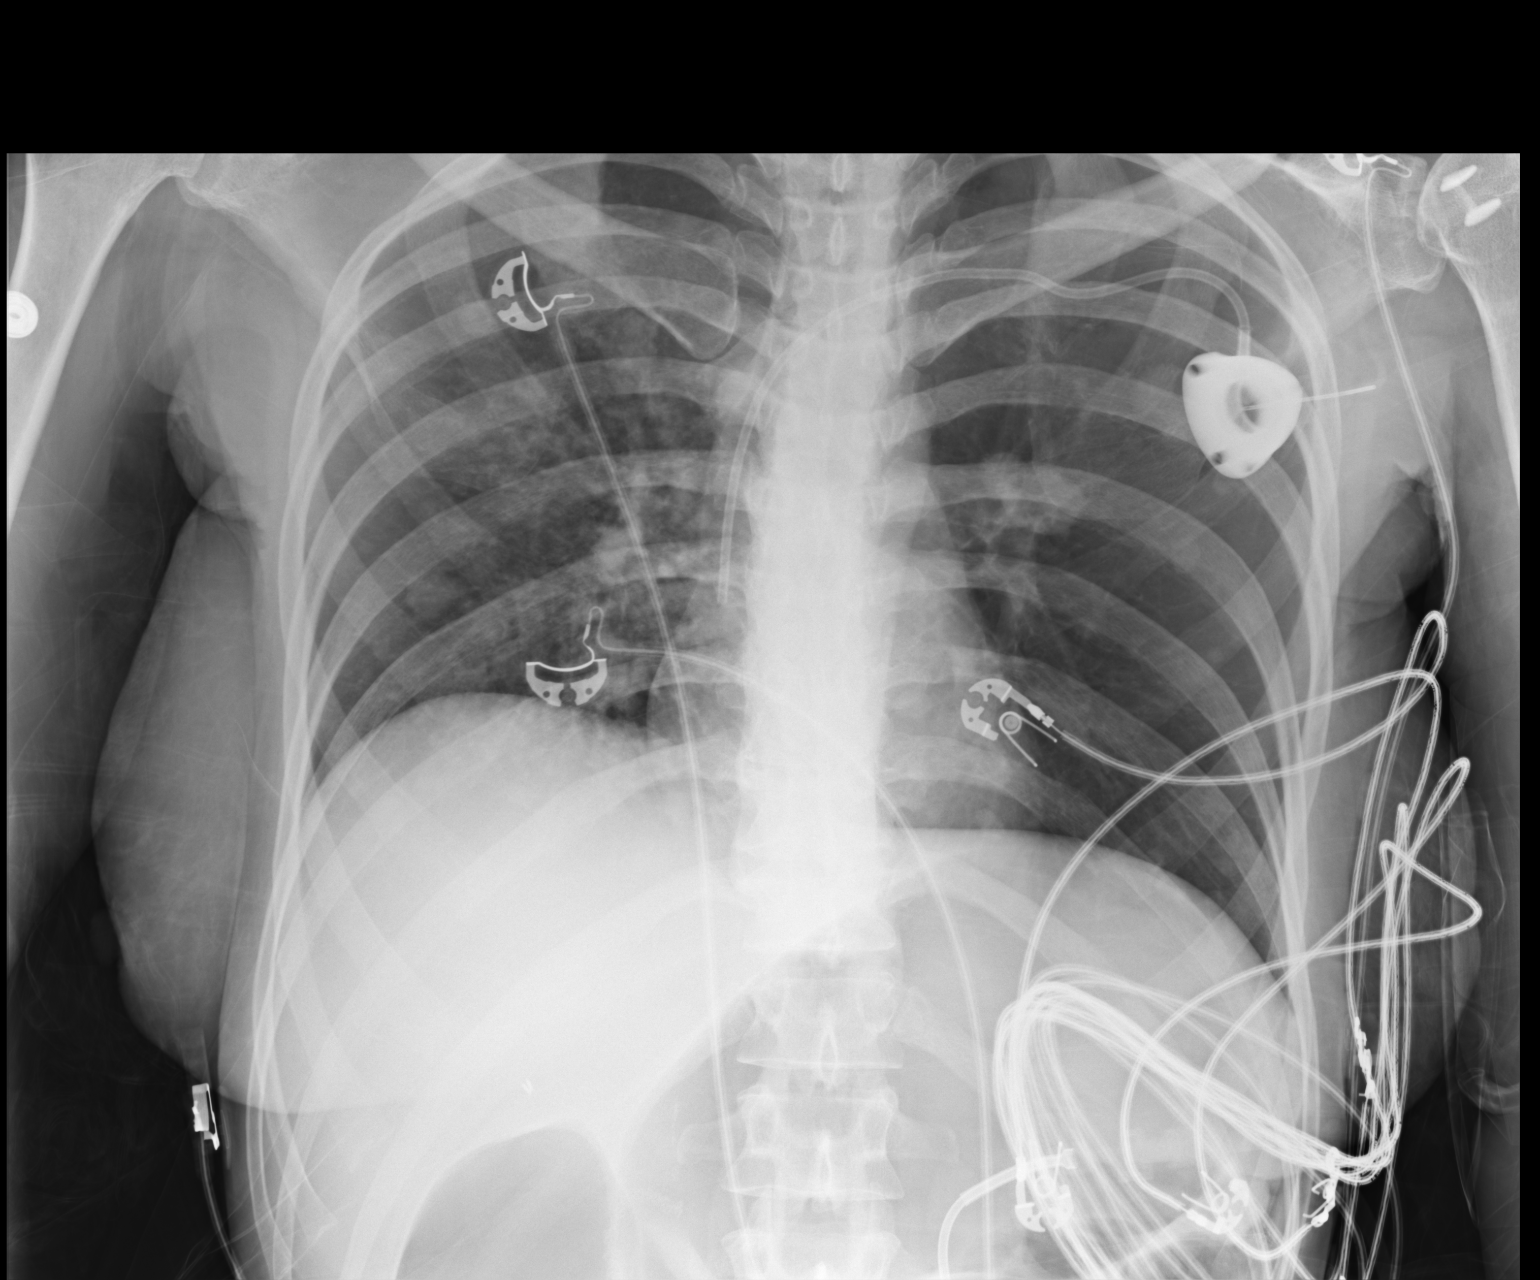

[1 of 1 positions shown; findings below may reference images not displayed]

FINDINGS: Left-sided Port-A-Cath in place with tip over the cavoatrial
junction. Heart size is normal. There is ill-defined predominantly
right upper lobe patchy reticular nodular opacity. No pleural
effusion. No pneumothorax. No acute osseous finding. Gaseous gastric
distention is partly visualized.
IMPRESSION: Patchy right upper lobe reticular nodular airspace opacity which
could indicate pneumonia. Metastatic disease is possible but less
likely. The patient has reportedly undergone radiation therapy, and
radiation pneumonitis is possible but also felt less likely.

## 2019-11-22 ENCOUNTER — Telehealth: Payer: Self-pay | Admitting: *Deleted

## 2019-11-22 NOTE — Telephone Encounter (Signed)
error
